# Patient Record
Sex: Female | Born: 1935 | Race: White | Hispanic: No | Smoking: Never smoker
Health system: Southern US, Community
[De-identification: ages and names within clinical notes are randomized; demographics above are authoritative.]

## PROBLEM LIST (undated history)

## (undated) DIAGNOSIS — Z9889 Other specified postprocedural states: Secondary | ICD-10-CM

## (undated) DIAGNOSIS — C50911 Malignant neoplasm of unspecified site of right female breast: Secondary | ICD-10-CM

## (undated) DIAGNOSIS — S329XXA Fracture of unspecified parts of lumbosacral spine and pelvis, initial encounter for closed fracture: Secondary | ICD-10-CM

## (undated) DIAGNOSIS — S42009A Fracture of unspecified part of unspecified clavicle, initial encounter for closed fracture: Secondary | ICD-10-CM

## (undated) DIAGNOSIS — K589 Irritable bowel syndrome without diarrhea: Secondary | ICD-10-CM

## (undated) DIAGNOSIS — R112 Nausea with vomiting, unspecified: Secondary | ICD-10-CM

## (undated) DIAGNOSIS — I10 Essential (primary) hypertension: Secondary | ICD-10-CM

## (undated) DIAGNOSIS — H269 Unspecified cataract: Secondary | ICD-10-CM

## (undated) DIAGNOSIS — M810 Age-related osteoporosis without current pathological fracture: Secondary | ICD-10-CM

## (undated) DIAGNOSIS — I82402 Acute embolism and thrombosis of unspecified deep veins of left lower extremity: Secondary | ICD-10-CM

## (undated) HISTORY — PX: HEMORRHOID SURGERY: SHX153

## (undated) HISTORY — PX: CATARACT EXTRACTION: SUR2

## (undated) HISTORY — PX: ABDOMINAL HYSTERECTOMY: SHX81

## (undated) HISTORY — PX: BREAST SURGERY: SHX581

---

## 2000-11-18 ENCOUNTER — Other Ambulatory Visit: Admission: RE | Admit: 2000-11-18 | Discharge: 2000-11-18 | Payer: Self-pay | Admitting: Internal Medicine

## 2001-10-02 ENCOUNTER — Emergency Department (HOSPITAL_COMMUNITY): Admission: EM | Admit: 2001-10-02 | Discharge: 2001-10-02 | Payer: Self-pay | Admitting: Emergency Medicine

## 2001-10-03 ENCOUNTER — Ambulatory Visit (HOSPITAL_COMMUNITY): Admission: RE | Admit: 2001-10-03 | Discharge: 2001-10-03 | Payer: Self-pay | Admitting: Internal Medicine

## 2001-11-24 ENCOUNTER — Ambulatory Visit (HOSPITAL_COMMUNITY): Admission: RE | Admit: 2001-11-24 | Discharge: 2001-11-24 | Payer: Self-pay | Admitting: Internal Medicine

## 2001-11-24 ENCOUNTER — Encounter: Payer: Self-pay | Admitting: Internal Medicine

## 2008-06-18 ENCOUNTER — Inpatient Hospital Stay (HOSPITAL_COMMUNITY): Admission: EM | Admit: 2008-06-18 | Discharge: 2008-06-20 | Payer: Self-pay | Admitting: Emergency Medicine

## 2008-06-18 ENCOUNTER — Encounter: Payer: Self-pay | Admitting: Emergency Medicine

## 2008-06-18 ENCOUNTER — Ambulatory Visit: Payer: Self-pay | Admitting: Cardiology

## 2008-07-16 ENCOUNTER — Encounter: Admission: RE | Admit: 2008-07-16 | Discharge: 2008-07-16 | Payer: Self-pay | Admitting: Internal Medicine

## 2010-09-24 ENCOUNTER — Encounter: Payer: Self-pay | Admitting: Internal Medicine

## 2011-01-16 NOTE — Consult Note (Signed)
Emily [name], MANNER NO.:  [id number]   MEDICAL RECORD NO.:  [id number]          PATIENT TYPE:  INP   LOCATION:  4733                         FACILITY:  [facility]   PHYSICIAN:  Marca Ancona, MD      DATE OF BIRTH:  1919-02-21   DATE OF CONSULTATION:  06/18/2008  DATE OF DISCHARGE:                                 CONSULTATION   The patient does not have a primary cardiologist.   PRIMARY CARE PHYSICIAN:  Dr. Aviva Kluver.   HISTORY OF PRESENT ILLNESS:  This is an 77 year old without significant  past cardiac history who fell getting out of bed this morning.  She hit  her right side and was found to have 3 right rib fractures and a right  clavicular fracture.  The patient states she woke at 2 in the morning  and needed to go to the bathroom.  She stood up out of bed and  immediately felt lightheaded upon standing.  She fell and hit her right  side on the bed.  There was no syncope.  There was no chest pain.  There  were no palpitations.  She did not feel that her heart had paused or  that her heart was racing.  After falling, she stood back up again, had  no further trouble, went to the bathroom, and went back to bed.  Today,  during the day, she continued to have pain in the right side and right  shoulder.  So she was taken by her family to the emergency department.  There, she was found to have the fractures.  Additionally, she was also  found to have a urinary tract infection.  She reports some urinary  frequency but no dysuria.  The patient does not have a history of  palpitations or syncope.  She has no history of chest pain or  significant dyspnea on exertion.  She has been taking a small dose of  metoprolol at night for hypos, although she denies ever having atrial  fibrillation or any other arrhythmia.   PAST MEDICAL HISTORY:  1. Gastroesophageal reflux disease.  2. Osteoporosis.  3. Degenerative joint disease.  4. History of breast cancer status post  lumpectomy.   MEDICATIONS:  Fosamax and metoprolol tartrate 25 mg p.o. q.p.m.   ALLERGIES:  No known drug allergies.   SOCIAL HISTORY:  The patient lives in [place] with her son and his  wife.  She is retired.  She does not smoke.  She does not drink alcohol  or use illicit drugs.   FAMILY HISTORY:  Not significant for any premature coronary artery  disease.   REVIEW OF SYSTEMS:  Negative except as noted in the history of present  illness.   X-rays show an acute distal right clavicular fracture that is minimally  displaced.  There is a lateral right 5th rib fracture and a right 11th  rib fracture.  Head CT without contrast shows no acute changes.  EKG  shows normal sinus rhythm at the rate of 86 beats per minute.  There is  a borderline interventricular conduction delay.  There  is poor anterior  R-wave progression.   LABORATORIES:  Significant for white count 7.8, hematocrit 38.6, and  platelets 165.  Potassium 3.8, creatinine 0.82, and glucose 110.  Cardiac enzymes are negative x1 set.  Urinalysis shows urinary tract  infection.   PHYSICAL EXAMINATION:  VITAL SIGNS:  Temperature 97.3, pulse 73, blood  pressure 116/63, and O2 sat 96% on 2 L nasal cannula.  GENERAL:  This is an elderly female in no apparent distress.  NEUROLOGICAL:  Alert and oriented x3.  Normal affect.  HEENT:  Normal.  ABDOMEN:  Soft and nontender.  No hepatosplenomegaly.  Normal bowel  sounds.  NECK:  Supple without lymphadenopathy.  No thyromegaly or thyroid  nodule.  JVP is 70 cm.  HEART:  Regular S1 and S2.  No S3.  No S4.  No murmur.  LUNGS:  Clear to auscultation bilaterally.  No respiratory effort.  EXTREMITIES:  There is no peripheral edema.  The posterior tibial pulses  are 2+ bilaterally.  No clubbing or cyanosis.  MUSCULOSKELETAL:  The right arm is in a sling and right shoulder is  tender to palpation.  SKIN:  Normal exam.   ASSESSMENT AND PLAN:  This is an 77 year old with no significant  past  cardiac history who presents after a fall with right clavicle and rib  fractures.  She states that she was lightheaded when she fell.  This  fall happened immediately after standing from lying down in bed.  This  sounds a lot like an orthostatic event, perhaps mediated by dehydration  from her urinary tract infection.  There was no actual episode of  syncope.  She has no history of arrhythmias or other cardiac disease.   Our plan for her would be to keep her on telemetry to assess her rhythm.  We will check a set of orthostatics and have that recorded in the chart.  We will also hold off on giving her metoprolol.  Finally, we will check  an echocardiogram.  She does not have a murmur on exam, and I think that  this event was orthostatic related.  So if she actually leaves over the  weekend, this echo can be done as an outpatient assuming that there are  no arrhythmias on telemetry.      Marca Ancona, MD  Electronically Signed     DM/MEDQ  D:  06/18/2008  T:  06/19/2008  Job:  [id number]

## 2011-01-16 NOTE — Discharge Summary (Signed)
Emily [name], Emily [name]              ACCOUNT NO.:  [id number]   MEDICAL RECORD NO.:  [id number]          PATIENT TYPE:  INP   LOCATION:  4733                         FACILITY:  [facility]   PHYSICIAN:  Ardeth Sportsman, MD     DATE OF BIRTH:  December 24, 1918   DATE OF ADMISSION:  06/18/2008  DATE OF DISCHARGE:  06/20/2008                               DISCHARGE SUMMARY   DISCHARGE DIAGNOSES:  1. Status post fall versus syncope.  2. Right rib fractures x3.  3. Right distal clavicle fracture.  4. Urinary tract infection.  Cultures pending at the time of the      discharge.  5. Osteoporosis.  6. Gastroesophageal reflux disease.  7. Tachycardia.  8. T2 and T3 compression fractures, subacute.   HISTORY ON ADMISSION:  This is a very pleasant 77 year old female who  fell getting up out of bed.  She did report getting mildly dizzy.  She  struck her right side of her shoulder and chest.  She was taken to  [facility] the following morning when she reported this to her  family and was found to have nondisplaced right rib fractures x3 and a  minimally displaced right distal clavicle fracture.  She was also found  to have positive urinalysis and likely has a urinary tract infection as  well.   She was transferred to [facility] for further evaluation and treatment.   She was admitted to the telemetry floor and had no overt arrhythmias.  She has initially kept off her metoprolol as it was felt that perhaps  she had gotten orthostatic due to some mild dehydration associated with  her urinary tract infection.  She had no orthostasis during this  admission and heart rates are running in the 70s to 100 range.  It was  recommended that she have a 2-D echocardiogram done, and this will be  done as an outpatient basis and followup.   With regards to the patient's right rib fractures, she had no  significant pain associated with this and was able to mobilize well.  She had a followup chest x-ray the  following day, which was clear,  showed no active disease.   Right distal clavicle fracture.  The patient did have a right distal  clavicle fracture.  This was treated with a sling, and the patient was  seen in consultation with Dr. Shelle Iron, who recommended followup in a  couple of weeks in his office.   The patient mobilized well with physical therapy and has a very  supportive family who planned to take her home.  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   At this time, the patient is prepared for discharge home.   MEDICATIONS AT THE TIME OF DISCHARGE:  1. Norco 5/325 mg 1-2 p.o. q.4 h. p.r.n. pain, #60, no refill.  2. Septra DS 1 tablet b.i.d. x4 more days.  3. Usual Fosamax weekly dose.  4. Metoprolol 25 mg  p.o. q.a.m.   The patient is to follow up with Dr. Shelle Iron in 2 weeks.  Follow up with  Trauma Service as needed.   She will call  Cardiology to arrange for an outpatient echo and  follow up with Dr. Freida Busman, I think saw her here in the hospital.  She  also needs to see her primary care physician to follow up with  urinalysis and urine culture at the end of the week to ensure that her  UTI is resolving.  Also, she did have note of a calcified left thyroid  nodule and this likely needs evaluation as well.  She can arrange for  this through her primary care physician as well.      Shawn Rayburn, P.A.      Ardeth Sportsman, MD  Electronically Signed    SR/MEDQ  D:  06/20/2008  T:  06/21/2008  Job:  [id number]

## 2011-01-16 NOTE — Consult Note (Signed)
Emily [name], Emily [name]              ACCOUNT NO.:  [id number]   MEDICAL RECORD NO.:  [id number]          PATIENT TYPE:  INP   LOCATION:  4733                         FACILITY:  [facility]   PHYSICIAN:  Jene Every, M.D.    DATE OF BIRTH:  [birth year]   DATE OF CONSULTATION:  DATE OF DISCHARGE:                                 CONSULTATION   CHIEF COMPLAINT:  Right shoulder pain.   HISTORY:  This is an 77 year old female who apparently had a fall,  syncopal episode, was seen in the emergency room, found to have a distal  clavicle resection and multiple rib fractures.  She was admitted by Dr.  Lindie Spruce, General Surgery Trauma Service to the telemetry unit.  Consulted  for evaluation of the distal clavicle and rib fracture.  The patient  also had rib fractures as well.   PAST MEDICAL HISTORY:  Breast cancer, GERD, osteoporosis, status post  lumpectomy.   SOCIAL HISTORY:  Tobacco negative.  Alcoholic beverages negative.  Lives  with her son.   PHYSICAL EXAMINATION:  VITAL SIGNS:  The patient is afebrile.  Blood  pressure 123/66 and pulse is 73.  GENERAL:  This is an elderly female, in mild amount of distress.  Mood  and affect is appropriate.  MUSCULOSKELETAL:  Inspection of the shoulder, soft tissue swelling noted  over the anterior aspect of the shoulder.  The skin is intact.  She is  tender on palpation over the distal clavicle.  Nontender over the  humerus, scapula, cervical spine, and over the sternoclavicular joint  medially.  She is tender to palpation in the anterolateral aspect of the  costochondral region.  Pulses are intact.  Distally in the upper  extremity compartments are soft, it is neurovascularly intact.   Radiographs of the shoulder demonstrate a clavicle fracture, distal  extra-articular, minimally displaced.  There are mild degenerative  changes at glenohumeral joint noted with type 2 acromion.  Rib fractures  noted.   IMPRESSION:  1. Acute closed distal clavicle  fracture extra-articular, minimally      displaced.  2. Associated rib fractures.   PLAN:  It is considerable that I would recommend a sling, ice to limit  the soft tissue swelling.  Gentle range of motion of the forearm and  shoulder as tolerated for she may be out of the sling although may not  tolerate that for the next week or so.  Instructed the patient and her  family that it might take up to 4 or 6 weeks for bone reunion.  We will  see her in the office in 2 weeks for the radiographs of the shoulder to  evaluate for the fracture healing.      Jene Every, M.D.  Electronically Signed     JB/MEDQ  D:  06/18/2008  T:  06/19/2008  Job:  [id number]

## 2011-06-04 LAB — DIFFERENTIAL
Basophils Absolute: 0
Eosinophils Absolute: 0
Eosinophils Relative: 0
Lymphocytes Relative: 13
Lymphs Abs: 1
Monocytes Relative: 6
Neutro Abs: 6.3

## 2011-06-04 LAB — URINE MICROSCOPIC-ADD ON

## 2011-06-04 LAB — URINALYSIS, ROUTINE W REFLEX MICROSCOPIC
Glucose, UA: NEGATIVE
Hgb urine dipstick: NEGATIVE
Ketones, ur: NEGATIVE
Nitrite: NEGATIVE
Protein, ur: NEGATIVE

## 2011-06-04 LAB — COMPREHENSIVE METABOLIC PANEL
AST: 43 — ABNORMAL HIGH
BUN: 15
Chloride: 105
GFR calc non Af Amer: >60
Glucose, Bld: 110 — ABNORMAL HIGH
Sodium: 141
Total Bilirubin: 1

## 2011-06-04 LAB — URINE CULTURE

## 2011-06-04 LAB — CBC
MCV: 92.7
RBC: 4.16
WBC: 7.8

## 2011-06-04 LAB — POCT CARDIAC MARKERS: Myoglobin, poc: 138

## 2012-04-15 ENCOUNTER — Inpatient Hospital Stay (HOSPITAL_COMMUNITY): Payer: Medicare Other

## 2012-04-15 ENCOUNTER — Emergency Department (HOSPITAL_COMMUNITY): Payer: Medicare Other

## 2012-04-15 ENCOUNTER — Inpatient Hospital Stay (HOSPITAL_COMMUNITY)
Admission: EM | Admit: 2012-04-15 | Discharge: 2012-04-17 | DRG: 536 | Disposition: A | Discharge status: Home Health | Payer: Medicare Other | Attending: Internal Medicine | Admitting: Internal Medicine

## 2012-04-15 ENCOUNTER — Encounter (HOSPITAL_COMMUNITY): Payer: Self-pay

## 2012-04-15 DIAGNOSIS — M810 Age-related osteoporosis without current pathological fracture: Secondary | ICD-10-CM | POA: Diagnosis present

## 2012-04-15 DIAGNOSIS — W010XXA Fall on same level from slipping, tripping and stumbling without subsequent striking against object, initial encounter: Secondary | ICD-10-CM | POA: Diagnosis present

## 2012-04-15 DIAGNOSIS — G4700 Insomnia, unspecified: Secondary | ICD-10-CM | POA: Diagnosis present

## 2012-04-15 DIAGNOSIS — Z66 Do not resuscitate: Secondary | ICD-10-CM | POA: Diagnosis present

## 2012-04-15 DIAGNOSIS — S329XXA Fracture of unspecified parts of lumbosacral spine and pelvis, initial encounter for closed fracture: Secondary | ICD-10-CM | POA: Diagnosis present

## 2012-04-15 DIAGNOSIS — S32509A Unspecified fracture of unspecified pubis, initial encounter for closed fracture: Principal | ICD-10-CM | POA: Diagnosis present

## 2012-04-15 DIAGNOSIS — I10 Essential (primary) hypertension: Secondary | ICD-10-CM | POA: Diagnosis present

## 2012-04-15 DIAGNOSIS — R52 Pain, unspecified: Secondary | ICD-10-CM | POA: Diagnosis present

## 2012-04-15 DIAGNOSIS — Y92009 Unspecified place in unspecified non-institutional (private) residence as the place of occurrence of the external cause: Secondary | ICD-10-CM

## 2012-04-15 HISTORY — DX: Other specified postprocedural states: Z98.890

## 2012-04-15 HISTORY — DX: Irritable bowel syndrome without diarrhea: K58.9

## 2012-04-15 HISTORY — DX: Unspecified cataract: H26.9

## 2012-04-15 HISTORY — DX: Fracture of unspecified part of unspecified clavicle, initial encounter for closed fracture: S42.009A

## 2012-04-15 HISTORY — DX: Essential (primary) hypertension: I10

## 2012-04-15 HISTORY — DX: Nausea with vomiting, unspecified: R11.2

## 2012-04-15 LAB — BASIC METABOLIC PANEL
BUN: 17 mg/dL (ref 6–23)
CO2: 30 mEq/L (ref 19–32)
Chloride: 103 mEq/L (ref 96–112)
Creatinine, Ser: 0.75 mg/dL (ref 0.50–1.10)
GFR calc Af Amer: 83 mL/min — ABNORMAL LOW (ref >90)
Glucose, Bld: 97 mg/dL (ref 70–99)

## 2012-04-15 LAB — CBC WITH DIFFERENTIAL/PLATELET
Basophils Relative: 0 % (ref 0–1)
HCT: 41.1 % (ref 36.0–46.0)
Hemoglobin: 13.5 g/dL (ref 12.0–15.0)
MCHC: 32.8 g/dL (ref 30.0–36.0)
Monocytes Absolute: 0.6 10*3/uL (ref 0.1–1.0)
Monocytes Relative: 5 % (ref 3–12)
Neutro Abs: 11.8 10*3/uL — ABNORMAL HIGH (ref 1.7–7.7)

## 2012-04-15 MED ORDER — SODIUM CHLORIDE 0.9 % IV SOLN
INTRAVENOUS | Status: DC
  Administered 2012-04-15 – 2012-04-16 (×2): via INTRAVENOUS

## 2012-04-15 MED ORDER — ONDANSETRON HCL 4 MG/2ML IJ SOLN: 4.0000 mg | Freq: Four times a day (QID) | INTRAMUSCULAR | Status: DC | PRN

## 2012-04-15 MED ORDER — ONDANSETRON HCL 4 MG/2ML IJ SOLN
4.0000 mg | Freq: Once | INTRAMUSCULAR | Status: AC
  Administered 2012-04-15: 4 mg via INTRAVENOUS
  Filled 2012-04-15: qty 2

## 2012-04-15 MED ORDER — MORPHINE SULFATE 2 MG/ML IJ SOLN
2.0000 mg | INTRAMUSCULAR | Status: DC | PRN
  Administered 2012-04-15: 2 mg via INTRAVENOUS
  Filled 2012-04-15: qty 1

## 2012-04-15 MED ORDER — METOPROLOL TARTRATE 25 MG PO TABS
25.0000 mg | ORAL_TABLET | Freq: Two times a day (BID) | ORAL | Status: DC
  Administered 2012-04-15 – 2012-04-17 (×4): 25 mg via ORAL
  Filled 2012-04-15 (×5): qty 1

## 2012-04-15 MED ORDER — ACETAMINOPHEN 650 MG RE SUPP: 650.0000 mg | Freq: Four times a day (QID) | RECTAL | Status: DC | PRN

## 2012-04-15 MED ORDER — DORZOLAMIDE HCL 2 % OP SOLN
1.0000 [drp] | Freq: Every day | OPHTHALMIC | Status: DC
  Filled 2012-04-15: qty 10

## 2012-04-15 MED ORDER — ACETAMINOPHEN 325 MG PO TABS
650.0000 mg | ORAL_TABLET | Freq: Four times a day (QID) | ORAL | Status: DC | PRN
  Administered 2012-04-16: 650 mg via ORAL
  Filled 2012-04-15: qty 2

## 2012-04-15 MED ORDER — SODIUM CHLORIDE 0.9 % IV SOLN
Freq: Once | INTRAVENOUS | Status: AC
  Administered 2012-04-15: 17:00:00 via INTRAVENOUS

## 2012-04-15 MED ORDER — ENOXAPARIN SODIUM 30 MG/0.3ML ~~LOC~~ SOLN
30.0000 mg | SUBCUTANEOUS | Status: DC
  Administered 2012-04-15 – 2012-04-16 (×2): 30 mg via SUBCUTANEOUS
  Filled 2012-04-15 (×3): qty 0.3

## 2012-04-15 MED ORDER — NEPAFENAC 0.1 % OP SUSP
3.0000 [drp] | Freq: Three times a day (TID) | OPHTHALMIC | Status: DC
  Administered 2012-04-16: 3 [drp] via OPHTHALMIC

## 2012-04-15 NOTE — H&P (Signed)
Emily [name] is an 78 y.o. female.   PCP:   Minda Meo, MD   Chief Complaint:  Pelvic fracture  HPI: 78 yo Elderly F from home, tripped and fell. No LOC, no blood-thinners. Landed on hand and left hip, no head injury. Pt reports pain at Left hip and mid back. Alert, oriented. Full ROM on all extremities. Acute nondisplaced fractures of the left superior and inferior pelvic ramus. 2.  No hip fracture.  Will admit for pain control and possibly needing SNF until recovered.  Currently on O2 and a little sleepy from the MSO4 that she has been getting for her pain.  Tripped on 65 yr old great Granddaughter.     Past Medical History:  Past Medical History  Diagnosis Date  . Hypertension   . Cataract     Past Surgical History  Procedure Date  . Cataract extraction       Allergies:   Allergies  Allergen Reactions  . Penicillins      Medications: Prior to Admission medications   Medication Sig Start Date End Date Taking? Authorizing Provider  dorzolamide (TRUSOPT) 2 % ophthalmic solution Place 1 drop into the left eye daily.   Yes Historical Provider, MD  metoprolol tartrate (LOPRESSOR) 25 MG tablet Take 25 mg by mouth 2 (two) times daily.   Yes Historical Provider, MD  nepafenac (NEVANAC) 0.1 % ophthalmic suspension Place 3 drops into the left eye 3 (three) times daily.   Yes Historical Provider, MD      (Not in a hospital admission)   Social History:  reports that she has never smoked. She does not have any smokeless tobacco history on file. She reports that she does not drink alcohol or use illicit drugs.  Lives with family  Family History: No family history on file.  Review of Systems:  Review of Systems - See HPI. All ROS o/w (-)   Physical Exam:  Blood pressure 156/70, pulse 78, temperature 98.7 F (37.1 C), temperature source Oral, resp. rate 18, SpO2 93.00%. Filed Vitals:   04/15/12 1445 04/15/12 1446 04/15/12 1540  BP: 157/100 173/73 156/70    Pulse: 75 81 78  Temp: 97.9 F (36.6 C) 98 F (36.7 C) 98.7 F (37.1 C)  TempSrc: Oral Oral Oral  Resp: 16 18 18   SpO2: 96% 97% 93%   General appearance: Looks younger than stated age. Head: Normocephalic, without obvious abnormality, atraumatic Eyes: conjunctivae/corneas clear. PERRL, EOM's intact.  Nose: Nares normal. Septum midline. Mucosa normal. No drainage or sinus tenderness. Throat: lips, mucosa, and tongue normal; teeth and gums normal Neck: no adenopathy, no carotid bruit, no JVD and thyroid not enlarged, symmetric, no tenderness/mass/nodules Resp: CTA Cardio: Reg GI: soft, non-tender; bowel sounds normal; no masses,  no organomegaly Extremities: extremities normal, atraumatic, no cyanosis or edema Pulses: 2+ and symmetric Lymph nodes:no cervical lymphadenopathy Neurologic: Alert and oriented X 3. Can move all 4s.  Pain with leg lifts. Groggy Kyphosis     Labs on Admission:   [facility] 04/15/12 1630  NA 140  K 3.8  CL 103  CO2 30  GLUCOSE 97  BUN 17  CREATININE 0.75  CALCIUM 9.7  MG --  PHOS --   No results found for this basename: AST:2,ALT:2,ALKPHOS:2,BILITOT:2,PROT:2,ALBUMIN:2 in the last 72 hours No results found for this basename: LIPASE:2,AMYLASE:2 in the last 72 hours  Basename 04/15/12 1630  WBC 13.5*  NEUTROABS 11.8*  HGB 13.5  HCT 41.1  MCV 91.7  PLT 162  No results found for this basename: CKTOTAL:3,CKMB:3,CKMBINDEX:3,TROPONINI:3 in the last 72 hours No results found for this basename: INR,  PROTIME     LAB RESULT POCT:  Results for orders placed during the hospital encounter of 04/15/12  CBC WITH DIFFERENTIAL      Component Value Range   WBC 13.5 (*) 4.0 - 10.5 K/uL   RBC 4.48  3.87 - 5.11 MIL/uL   Hemoglobin 13.5  12.0 - 15.0 g/dL   HCT 16.1  09.6 - 04.5 %   MCV 91.7  78.0 - 100.0 fL   MCH 30.1  26.0 - 34.0 pg   MCHC 32.8  30.0 - 36.0 g/dL   RDW 40.9  81.1 - 91.4 %   Platelets 162  150 - 400 K/uL   Neutrophils Relative  88 (*) 43 - 77 %   Neutro Abs 11.8 (*) 1.7 - 7.7 K/uL   Lymphocytes Relative 7 (*) 12 - 46 %   Lymphs Abs 1.0  0.7 - 4.0 K/uL   Monocytes Relative 5  3 - 12 %   Monocytes Absolute 0.6  0.1 - 1.0 K/uL   Eosinophils Relative 0  0 - 5 %   Eosinophils Absolute 0.0  0.0 - 0.7 K/uL   Basophils Relative 0  0 - 1 %   Basophils Absolute 0.0  0.0 - 0.1 K/uL  BASIC METABOLIC PANEL      Component Value Range   Sodium 140  135 - 145 mEq/L   Potassium 3.8  3.5 - 5.1 mEq/L   Chloride 103  96 - 112 mEq/L   CO2 30  19 - 32 mEq/L   Glucose, Bld 97  70 - 99 mg/dL   BUN 17  6 - 23 mg/dL   Creatinine, Ser 7.82  0.50 - 1.10 mg/dL   Calcium 9.7  8.4 - 95.6 mg/dL   GFR calc non Af Amer 72 (*) >90 mL/min   GFR calc Af Amer 83 (*) >90 mL/min      Radiological Exams on Admission: Dg Hip Complete Left  04/15/2012  *RADIOLOGY REPORT*  Clinical Data: Larey Seat, left hip pain  LEFT HIP - COMPLETE 2+ VIEW  Comparison: None.  Findings: Both hips are in normal position with only mild to moderate degenerative joint disease for age.  No acute fracture is seen.  However, there are acute fractures of both the left superior and inferior pelvic ramus.  The right ramus is intact.  The SI joints appear normal.  There are degenerative changes in the lower lumbar spine.  IMPRESSION:  1.  Acute nondisplaced fractures of the left superior and inferior pelvic ramus. 2.  No hip fracture.  Original Report Authenticated By: Juline Patch, M.D.      No orders found for this or any previous visit.   Assessment/Plan Principal Problem:  *Pelvic fracture Active Problems:  Pain  HTN (hypertension)  Osteoporosis  Pelvic Fracture in Pt with Osteoporosis and presumed Vertebral Fxs and h/o Shoulder Fx that she saw Dr Madelon Lips in past. Admit. Pain control. Foley PT/OT/CW. Will need SNF HTN - BP is better post treatment of pain.  On Toprol Monitor. DNR - per pt request. OK for home eye gtts. O2 ? Prolia vrs Forteo. May need  fiber but she has IBS and normally has some Diarrhea - will monitor.  Shanikka Wonders M 04/15/2012, 7:33 PM

## 2012-04-15 NOTE — ED Provider Notes (Signed)
History     CSN: [id number]  Arrival date & time 04/15/12  1427   First MD Initiated Contact with Patient 04/15/12 1554      Chief Complaint  Patient presents with  . Fall    (Consider location/radiation/quality/duration/timing/severity/associated sxs/prior treatment) Patient is a 78 y.o. female presenting with fall. The history is provided by the patient.  Fall   patient here after a fall from tripping over a small animal. There was no loss of consciousness. Complains of pain to her left hip and was unable to ambulate after the incident. Denies any neck or back pain. Denies any rib or abdominal pain. Pain starts in her left hip based on her leg. Denies any numbness to her extremity. EMS was called and patient transported here  Past Medical History  Diagnosis Date  . Hypertension   . Cataract     Past Surgical History  Procedure Date  . Cataract extraction     No family history on file.  History  Substance Use Topics  . Smoking status: Never Smoker   . Smokeless tobacco: Not on file  . Alcohol Use: No    OB History    Grav Para Term Preterm Abortions TAB SAB Ect Mult Living                  Review of Systems  All other systems reviewed and are negative.    Allergies  Penicillins  Home Medications   Current Outpatient Rx  Name Route Sig Dispense Refill  . DORZOLAMIDE HCL 2 % OP SOLN Left Eye Place 1 drop into the left eye daily.    Marland Kitchen METOPROLOL TARTRATE 25 MG PO TABS Oral Take 25 mg by mouth 2 (two) times daily.    Marland Kitchen NEPAFENAC 0.1 % OP SUSP Left Eye Place 3 drops into the left eye 3 (three) times daily.      BP 156/70  Pulse 78  Temp 98.7 F (37.1 C) (Oral)  Resp 18  SpO2 93%  Physical Exam  Nursing note and vitals reviewed. Constitutional: She is oriented to person, place, and time. She appears well-developed and well-nourished.  Non-toxic appearance. No distress.  HENT:  Head: Normocephalic and atraumatic.  Eyes: Conjunctivae, EOM and lids  are normal. Pupils are equal, round, and reactive to light.  Neck: Normal range of motion. Neck supple. No tracheal deviation present. No mass present.  Cardiovascular: Normal rate, regular rhythm and normal heart sounds.  Exam reveals no gallop.   No murmur heard. Pulmonary/Chest: Effort normal and breath sounds normal. No stridor. No respiratory distress. She has no decreased breath sounds. She has no wheezes. She has no rhonchi. She has no rales.  Abdominal: Soft. Normal appearance and bowel sounds are normal. She exhibits no distension. There is no tenderness. There is no rebound and no CVA tenderness.  Musculoskeletal: Normal range of motion. She exhibits no edema and no tenderness.       Left lower extremity pain with movement. No shortening or rotation. Left dp pulse 2 plus, ski intact  Neurological: She is alert and oriented to person, place, and time. She has normal strength. No cranial nerve deficit or sensory deficit. GCS eye subscore is 4. GCS verbal subscore is 5. GCS motor subscore is 6.  Skin: Skin is warm and dry. No abrasion and no rash noted.  Psychiatric: She has a normal mood and affect. Her speech is normal and behavior is normal.    ED Course  Procedures (including critical  care time)   Labs Reviewed  CBC WITH DIFFERENTIAL  BASIC METABOLIC PANEL   No results found.   No diagnosis found.    MDM  Spoke with dr. Timothy Lasso, wil come to admit for pain control and NH placment        Toy Baker, MD 04/15/12 1900

## 2012-04-15 NOTE — ED Notes (Signed)
From home, tripped and fell. No LOC, no blood-thinners. Landed on hand and left hip, no head injury. Pt reports pain at Left hip and mid back. Alert, oriented. Full ROM on all extremities.

## 2012-04-15 NOTE — ED Notes (Signed)
WUJ:[id number]<JY> Expected date:04/15/12<BR> Expected time: 2:11 PM<BR> Means of arrival:Ambulance<BR> Comments:<BR> [place]

## 2012-04-15 NOTE — Progress Notes (Signed)
Richard aronson pcp EPIC updated

## 2012-04-16 LAB — BASIC METABOLIC PANEL
BUN: 19 mg/dL (ref 6–23)
CO2: 27 mEq/L (ref 19–32)
Calcium: 9 mg/dL (ref 8.4–10.5)
Creatinine, Ser: 0.78 mg/dL (ref 0.50–1.10)
Glucose, Bld: 130 mg/dL — ABNORMAL HIGH (ref 70–99)

## 2012-04-16 LAB — CBC
MCH: 30.6 pg (ref 26.0–34.0)
MCHC: 33.3 g/dL (ref 30.0–36.0)
MCV: 91.9 fL (ref 78.0–100.0)
Platelets: 132 10*3/uL — ABNORMAL LOW (ref 150–400)
RDW: 13.1 % (ref 11.5–15.5)

## 2012-04-16 MED ORDER — CALCITONIN (SALMON) 200 UNIT/ACT NA SOLN
1.0000 | Freq: Every day | NASAL | Status: DC
  Administered 2012-04-16 – 2012-04-17 (×2): 1 via NASAL
  Filled 2012-04-16: qty 3.7

## 2012-04-16 MED ORDER — HYDROCODONE-ACETAMINOPHEN 5-325 MG PO TABS
1.0000 | ORAL_TABLET | ORAL | Status: DC | PRN
  Administered 2012-04-16: 2 via ORAL
  Administered 2012-04-16 – 2012-04-17 (×3): 1 via ORAL
  Filled 2012-04-16: qty 1
  Filled 2012-04-16: qty 2
  Filled 2012-04-16 (×2): qty 1

## 2012-04-16 MED ORDER — NEPAFENAC 0.1 % OP SUSP
1.0000 [drp] | Freq: Three times a day (TID) | OPHTHALMIC | Status: DC
  Administered 2012-04-16 – 2012-04-17 (×3): 1 [drp] via OPHTHALMIC

## 2012-04-16 MED ORDER — ZOLPIDEM TARTRATE 5 MG PO TABS: 5.0000 mg | ORAL_TABLET | Freq: Every evening | ORAL | Status: DC | PRN

## 2012-04-16 NOTE — Progress Notes (Signed)
CSW met with pt/family this morning to assist with d/c planning. PT has evaluated and recommends HHPT. Pt/Family are in agreement with this decision. RNCM will assist with arranging home services.  Cori Razor LCSW [phone]

## 2012-04-16 NOTE — Progress Notes (Signed)
CARE MANAGEMENT NOTE 04/16/2012  Patient:  Emily [name], Emily [name]   Account Number:  [id number]  Date Initiated:  04/16/2012  Documentation initiated by:  Colleen Can  Subjective/Objective Assessment:   dx bilateral pelvic fracture following fall at home     Action/Plan:   CM spoke with patient and family members. Plans are for patient to return to her home in [place], [place] where her 2 sons and thire wives will be her caregivers. She is declining SNF. Pt is requesting HH services.   Anticipated DC Date:  04/18/2012   Anticipated DC Plan:  HOME W HOME HEALTH SERVICES  In-house referral  Clinical Social Worker      DC Planning Services  CM consult      PAC Choice  DURABLE MEDICAL EQUIPMENT  HOME HEALTH   Choice offered to / List presented to:  C-4 Adult Children   DME arranged  NA      DME agency  NA        HH agency  OTHER - SEE NOTE   Status of service:  In process, will continue to follow Medicare Important Message given?   (If response is "NO", the following Medicare IM given date fields will be blank) Date Medicare IM given:   Date Additional Medicare IM given:    Discharge Disposition:    Per UR Regulation:  Reviewed for med. necessity/level of care/duration of stay  Comments:  04/16/2012 Raynelle Bring BSN CCM [phone] Family is requesting [facility] -[phone] [place] for [facility] services .Pt already has RW. BSC. They have access to wheelchair as needed. Tehy are requesting specific physical therapist. Home health orders for [facility] and HHpt services need to be written by MD. These ordrs will be faxed to [facility] agency. CM will follow for orders.

## 2012-04-16 NOTE — Progress Notes (Signed)
Subjective: Feeling better.  Pain is controlled when lying flat.  Pain increases when she rolls over or tries to bear weight.  "When can I go back to work."  Objective: Vital signs in last 24 hours: Temp:  [97.9 F (36.6 C)-99.3 F (37.4 C)] 99.3 F (37.4 C) (08/14 0630) Pulse Rate:  [75-83] 79  (08/14 0630) Resp:  [16-18] 16  (08/14 0630) BP: (117-173)/(63-100) 117/64 mmHg (08/14 0630) SpO2:  [90 %-98 %] 91 % (08/14 0630) Weight:  [48.081 kg (106 lb)] 48.081 kg (106 lb) (08/13 2110) Weight change:  Last BM Date: 04/15/12  CBG (last 3)  No results found for this basename: GLUCAP:3 in the last 72 hours  Intake/Output from previous day: 08/13 0701 - 08/14 0700 In: 271.3 [P.O.:120; I.V.:151.3] Out: -  Intake/Output this shift:    General appearance: alert and no acute distress, appears younger than stated age Eyes: no scleral icterus Throat: oropharynx moist without erythema Resp: clear to auscultation bilaterally Cardio: regular rate and rhythm, S1, S2 normal, no murmur, click, rub or gallop GI: soft, non-tender; bowel sounds normal; no masses,  no organomegaly Extremities: no clubbing, cyanosis or edema; minimal tenderness over left posterior hip   Lab Results:  Basename 04/16/12 0441 04/15/12 1630  NA 140 140  K 3.8 3.8  CL 104 103  CO2 27 30  GLUCOSE 130* 97  BUN 19 17  CREATININE 0.78 0.75  CALCIUM 9.0 9.7  MG -- --  PHOS -- --   No results found for this basename: AST:2,ALT:2,ALKPHOS:2,BILITOT:2,PROT:2,ALBUMIN:2 in the last 72 hours  Basename 04/16/12 0441 04/15/12 1630  WBC 10.4 13.5*  NEUTROABS -- 11.8*  HGB 11.8* 13.5  HCT 35.4* 41.1  MCV 91.9 91.7  PLT 132* 162   No results found for this basename: INR, PROTIME   No results found for this basename: CKTOTAL:3,CKMB:3,CKMBINDEX:3,TROPONINI:3 in the last 72 hours No results found for this basename: TSH,T4TOTAL,FREET3,T3FREE,THYROIDAB in the last 72 hours No results found for this basename:  VITAMINB12:2,FOLATE:2,FERRITIN:2,TIBC:2,IRON:2,RETICCTPCT:2 in the last 72 hours  Studies/Results: Dg Hip Complete Left  04/15/2012  *RADIOLOGY REPORT*  Clinical Data: Larey Seat, left hip pain  LEFT HIP - COMPLETE 2+ VIEW  Comparison: None.  Findings: Both hips are in normal position with only mild to moderate degenerative joint disease for age.  No acute fracture is seen.  However, there are acute fractures of both the left superior and inferior pelvic ramus.  The right ramus is intact.  The SI joints appear normal.  There are degenerative changes in the lower lumbar spine.  IMPRESSION:  1.  Acute nondisplaced fractures of the left superior and inferior pelvic ramus. 2.  No hip fracture.  Original Report Authenticated By: Juline Patch, M.D.   Portable Chest 1 View  04/15/2012  *RADIOLOGY REPORT*  Clinical Data: Hypoxia.  Kyphosis.  PORTABLE CHEST - 1 VIEW  Comparison: 06/19/2008.  Findings: Emphysema is severe.  No pneumothorax.  Osteopenia. Right axillary dissection clips.  No airspace disease or effusion.  The pulmonary vascular congestion is present with interstitial pulmonary edema.  Mitral annular calcification is present.  Interlobular septal thickening is present in the periphery.  Subsegmental atelectasis present in the left mid and lower lung. The cardiopericardial silhouette appears similar to prior exam, within normal limits for projection.  IMPRESSION: 1.  Severe emphysema. 2.  Pulmonary vascular congestion and interstitial pulmonary edema.  Original Report Authenticated By: Andreas Newport, M.D.     Medications: Scheduled:   . sodium chloride   Intravenous  Once  . dorzolamide  1 drop Left Eye Daily  . enoxaparin (LOVENOX) injection  30 mg Subcutaneous Q24H  . metoprolol tartrate  25 mg Oral BID  . nepafenac  3 drop Left Eye TID  . ondansetron  4 mg Intravenous Once   Continuous:   . sodium chloride 20 mL/hr at 04/15/12 2226    Assessment/Plan: Principal Problem: 1. *Pelvic  fracture- continue pain management- will provide Tylenol, Vicodin, MSO4 as options for mild, moderate, severe pain, respectively.  Add Miacalcin.  She prefers to go home with 24 hour assistance (including family members who are PT and RN).  Will ask PT to assist with home DME recommendations (may benefit from hospital bed, bedside commode, walker).    Active Problems: 2. HTN (hypertension)- BP controlled with home meds.   3. Osteoporosis- add Miaclcin for acute bone pain.  Currently on "drug holiday" after prolonged bisphosphonate use (~12 years).  Consider forteo once stable. 4. Insomnia- will use low dose Ambien in the hospital but hesitant to use at home due to fall precautions. 5. Disposition- anticipate discharge to home in 1-2 days if pain controlled with oral medications and home care in place.     LOS: 1 day   Dalaya Suppa,W DOUGLAS 04/16/2012, 7:27 AM

## 2012-04-16 NOTE — Progress Notes (Signed)
Social work consult received for possible SNF placement .H&P reviewed. CSW will see pt this am to assist with d/c planning. PT Eval pending.  Cori Razor LCSW [phone]

## 2012-04-16 NOTE — Evaluation (Signed)
Physical Therapy Evaluation Patient Details Name: Emily [name] MRN: [id number] DOB: October 31, 1918 Today's Date: 04/16/2012 Time: 4098-1191 PT Time Calculation (min): 13 min  PT Assessment / Plan / Recommendation Clinical Impression  78 yo female admitted with acute non-displaced fracture of L superior/inferior pelvic ramus. Family very supportive and they feel they can manage with pt at home. Recommend HHPT.     PT Assessment  Patient needs continued PT services    Follow Up Recommendations  Home health PT;Supervision/Assistance - 24 hour    Barriers to Discharge        Equipment Recommendations  Youth Rolling walker with 5" wheels;3 in 1 bedside commode; (Possibly wheelchair)   Recommendations for Other Services OT consult   Frequency Min 3X/week    Precautions / Restrictions Precautions Precautions: Fall Restrictions Weight Bearing Restrictions: No   Pertinent Vitals/Pain       Mobility  Bed Mobility Bed Mobility: Supine to Sit Supine to Sit: HOB elevated;With rails;4: Min assist Details for Bed Mobility Assistance: VCs safety, technique. Increased time. Assist for L LE off bed. Transfers Transfers: Sit to Stand;Stand to [employer] Transfers Sit to Stand: 4: Min assist;With upper extremity assist;From bed Stand to Sit: 4: Min assist;With upper extremity assist;To chair/3-in-1 Stand Pivot Transfers: 4: Min assist Details for Transfer Assistance: VCs safety, technique, hand placement. Assit to rise, stabilize, control descent, maneuver with RW. Ambulation/Gait Ambulation/Gait Assistance: 4: Min assist Ambulation Distance (Feet): 3 Feet Assistive device: Rolling walker Ambulation/Gait Assistance Details: VCS safety, technique, sequence. Pt reports increased pain with WBing. Limited activity tolerance.  Gait Pattern: Step-to pattern;Trunk flexed;Antalgic;Decreased stride length;Decreased step length - right;Decreased step length - left    Exercises     PT  Diagnosis: Difficulty walking;Abnormality of gait;Acute pain  PT Problem List: Decreased activity tolerance;Decreased mobility;Pain;Decreased knowledge of use of DME PT Treatment Interventions: DME instruction;Gait training;Functional mobility training;Therapeutic activities;Therapeutic exercise;Patient/family education   PT Goals Acute Rehab PT Goals PT Goal Formulation: With patient/family Time For Goal Achievement: 04/23/12 Potential to Achieve Goals: Fair Pt will go Supine/Side to Sit: with supervision PT Goal: Supine/Side to Sit - Progress: Goal set today Pt will go Sit to Supine/Side: with supervision PT Goal: Sit to Supine/Side - Progress: Goal set today Pt will go Sit to Stand: with supervision PT Goal: Sit to Stand - Progress: Goal set today Pt will Transfer Bed to Chair/Chair to Bed: with supervision PT Transfer Goal: Bed to Chair/Chair to Bed - Progress: Goal set today Pt will Ambulate: 16 - 50 feet;with supervision;with rolling walker PT Goal: Ambulate - Progress: Goal set today  Visit Information  Last PT Received On: 04/16/12 Assistance Needed: +1    Subjective Data  Subjective: "Its a 10 (when walking)" Patient Stated Goal: Less pain. Home   Prior Functioning  Home Living Lives With: Family Available Help at Discharge: Family Type of Home: House Home Access: Ramped entrance Home Layout: One level Bathroom Toilet: Standard Home Adaptive Equipment: None Prior Function Level of Independence: Independent Communication Communication: HOH    Cognition  Overall Cognitive Status: Appears within functional limits for tasks assessed/performed Arousal/Alertness: Awake/alert Orientation Level: Appears intact for tasks assessed Behavior During Session: [facility] for tasks performed    Extremity/Trunk Assessment Right Lower Extremity Assessment RLE ROM/Strength/Tone: [facility] for tasks assessed Left Lower Extremity Assessment LLE ROM/Strength/Tone: Deficits;Unable to fully  assess;Due to pain   Balance    End of Session PT - End of Session Equipment Utilized During Treatment: Gait belt Activity Tolerance: Patient limited by  pain Patient left: in chair;with call bell/phone within reach;with family/visitor present  GP     Rebeca Alert [facility] 04/16/2012, 11:03 AM [phone]

## 2012-04-17 MED ORDER — HYDROCODONE-ACETAMINOPHEN 5-325 MG PO TABS: 1.0000 | ORAL_TABLET | ORAL | Status: AC | PRN

## 2012-04-17 MED ORDER — ACETAMINOPHEN 325 MG PO TABS: 650.0000 mg | ORAL_TABLET | Freq: Four times a day (QID) | ORAL | Status: AC | PRN

## 2012-04-17 MED ORDER — CALCITONIN (SALMON) 200 UNIT/ACT NA SOLN: NASAL | Status: DC

## 2012-04-17 NOTE — Care Management Note (Signed)
    Page 1 of 2   04/17/2012     11:51:35 AM   CARE MANAGEMENT NOTE 04/17/2012  Patient:  Emily [name], Emily [name]   Account Number:  [id number]  Date Initiated:  04/16/2012  Documentation initiated by:  Colleen Can  Subjective/Objective Assessment:   dx bilateral pelvic fracture following fall at home     Action/Plan:   CM spoke with patient and family members. Plans are for patient to return to her home in [place], [place] where her 2 sons and thire wives will be her caregivers. She is declining SNF. Pt is requesting HH services.   Anticipated DC Date:  04/18/2012   Anticipated DC Plan:  HOME W HOME HEALTH SERVICES  In-house referral  Clinical Social Worker      DC Planning Services  CM consult      PAC Choice  DURABLE MEDICAL EQUIPMENT  HOME HEALTH   Choice offered to / List presented to:  C-4 Adult Children   DME arranged  NA      DME agency  NA     HH arranged  HH-1 RN  HH-2 PT  HH-3 OT      [facility] agency  [facility]   Status of service:  Completed, signed off Medicare Important Message given?  NA - LOS <3 / Initial given by admissions (If response is "NO", the following Medicare IM given date fields will be blank) Date Medicare IM given:   Date Additional Medicare IM given:    Discharge Disposition:  HOME W HOME HEALTH SERVICES  Per UR Regulation:  Reviewed for med. necessity/level of care/duration of stay  If discussed at Long Length of Stay Meetings, dates discussed:    Comments:  04/17/2012 Raynelle Bring BSN CCM [phone] CM called by Olegario Messier at [facility] to advise that they are not in network with pt's insurance. Pt and family members notified. They want a network provider and are requesting [facility] to provide [facility] services. CM talked with Tammy at [facility] [phone] who states they are in network and to send face sheet, hh orders, h&P, PT notes, d/c summary, face to face to [phone]. Confirmation received.  Pt and daughter-in-law notified that [facility] will provide [facility] services .   04/16/2012 Raynelle Bring BSN CCM [phone] Family is requesting Care [facility] -[phone] [place] for [facility] services .Pt already has RW. BSC. They have access to wheelchair as needed. They are requesting specific physical therapist. Home health orders for [facility] and HHpt services need to be written by MD. These ordrs will be faxed to [facility] agency. CM will follow for orders.

## 2012-04-17 NOTE — Evaluation (Signed)
Occupational Therapy Evaluation Patient Details Name: MARC SIVERTSEN MRN: [id number] DOB: 1918/09/23 Today's Date: 04/17/2012 Time: 4098-1191 OT Time Calculation (min): 17 min  OT Assessment / Plan / Recommendation Clinical Impression  This 78 year old female was admitted for nondisplaced pelvic fx.  She plans to discharge home with family's assistance.  All education was completed. No further OT needs at this time.  Pt has 3:1 commode and shower seat at home    OT Assessment  Patient does not need any further OT services    Follow Up Recommendations  No OT follow up    Barriers to Discharge      Equipment Recommendations  None recommended by OT;None recommended by PT (has all dme)    Recommendations for Other Services    Frequency       Precautions / Restrictions Precautions Precautions: Fall Restrictions Weight Bearing Restrictions: No   Pertinent Vitals/Pain None sitting, increases with weight bearing (8/10); repositioned, and pain began to subside    ADL  Lower Body Dressing: Performed;Maximal assistance Where Assessed - Lower Body Dressing: Supported sit to stand Toilet Transfer: Simulated;Minimal assistance Toilet Transfer Method: Sit to stand (ambulated around bed to chair) Transfers/Ambulation Related to ADLs: min A ambulating in room ADL Comments: Educated on shower transfer:  pt believes she will be able to do this, but right now, pain increases with weight bearing.  Reviewed AE (long handled net).  She doesn't have a reacher, and family will help with adls.      OT Diagnosis:    OT Problem List:   OT Treatment Interventions:     OT Goals    Visit Information  Last OT Received On: 04/17/12 Assistance Needed: +1    Subjective Data  Subjective: Someone will be with her all the time.  We live next door Patient Stated Goal: Get back to work:  owns tie store in [place]   Prior Functioning  Vision/Perception  Home Living Available Help at Discharge:  Family Bathroom Shower/Tub: Health visitor: Standard Prior Function Level of Independence: Independent Driving: Yes Communication Communication: Licensed conveyancer  Overall Cognitive Status: Appears within functional limits for tasks assessed/performed Arousal/Alertness: Awake/alert Orientation Level: Appears intact for tasks assessed Behavior During Session: [facility] for tasks performed    Extremity/Trunk Assessment Right Upper Extremity Assessment RUE ROM/Strength/Tone: [facility] for tasks assessed Left Upper Extremity Assessment LUE ROM/Strength/Tone: WFL for tasks assessed   Mobility  Transfers Sit to Stand: 4: Min assist;With upper extremity assist;From bed .    Exercise    Balance    End of Session OT - End of Session Activity Tolerance: Patient tolerated treatment well Patient left: in chair;with call bell/phone within reach;with family/visitor present  GO     Desha Bitner 04/17/2012, 10:15 AM Marica Otter, OTR/L [phone] 04/17/2012

## 2012-04-17 NOTE — Discharge Summary (Signed)
DISCHARGE SUMMARY  Emily [name]  MR#: [id number]  DOB:06/08/1919  Date of Admission: 04/15/2012 Date of Discharge: 04/17/2012  Attending Physician:Ashlen Kiger,W DOUGLAS  Patient's NWG:[id number],[id number] A, MD  Consults: None indicated  Discharge Diagnoses: Principal Problem:  *Pelvic fracture Active Problems:  Pain  HTN (hypertension)  Osteoporosis  Past Medical History  Diagnosis Date  . Hypertension   . Cataract   . Cancer     right breast cancer  . IBS (irritable bowel syndrome)   . PONV (postoperative nausea and vomiting)   . Clavicle fracture     hx of in approx. 2010   Past Surgical History  Procedure Date  . Cataract extraction   . Abdominal hysterectomy   . Breast surgery     R breast lumpectomy - cancer  . Hemorrhoid surgery     Discharge Medications: Medication List  As of 04/17/2012  7:52 AM   TAKE these medications         acetaminophen 325 MG tablet   Commonly known as: TYLENOL   Take 2 tablets (650 mg total) by mouth every 6 (six) hours as needed (or Fever >/= 101).      calcitonin (salmon) 200 UNIT/ACT nasal spray   Commonly known as: MIACALCIN/FORTICAL   1 spray in alternating nasal passage daily      Difluprednate 0.05 % Emul   Place 1 drop into the left eye every morning. Per patient, this eye drop is supposed to stop on Monday 8/19.      HYDROcodone-acetaminophen 5-325 MG per tablet   Commonly known as: NORCO/VICODIN   Take 1-2 tablets by mouth every 4 (four) hours as needed.      metoprolol tartrate 25 MG tablet   Commonly known as: LOPRESSOR   Take 25 mg by mouth 2 (two) times daily.      nepafenac 0.1 % ophthalmic suspension   Commonly known as: NEVANAC   Place 1 drop into the left eye 3 (three) times daily.            Hospital Procedures: Dg Hip Complete Left 04/15/2012  *RADIOLOGY REPORT*  Clinical Data: Larey Seat, left hip pain  LEFT HIP - COMPLETE 2+ VIEW  Comparison: None.  Findings: Both hips are in normal position with only mild  to moderate degenerative joint disease for age.  No acute fracture is seen.  However, there are acute fractures of both the left superior and inferior pelvic ramus.  The right ramus is intact.  The SI joints appear normal.  There are degenerative changes in the lower lumbar spine.  IMPRESSION:  1.  Acute nondisplaced fractures of the left superior and inferior pelvic ramus. 2.  No hip fracture.  Original Report Authenticated By: Juline Patch, M.D.   Portable Chest 1 View 04/15/2012  *RADIOLOGY REPORT*  Clinical Data: Hypoxia.  Kyphosis.  PORTABLE CHEST - 1 VIEW  Comparison: 06/19/2008.  Findings: Emphysema is severe.  No pneumothorax.  Osteopenia. Right axillary dissection clips.  No airspace disease or effusion.  The pulmonary vascular congestion is present with interstitial pulmonary edema.  Mitral annular calcification is present.  Interlobular septal thickening is present in the periphery.  Subsegmental atelectasis present in the left mid and lower lung. The cardiopericardial silhouette appears similar to prior exam, within normal limits for projection.  IMPRESSION: 1.  Severe emphysema. 2.  Pulmonary vascular congestion and interstitial pulmonary edema.  Original Report Authenticated By: Andreas Newport, M.D.    History of Present Illness (from Dr. Ferd Hibbs admission H&P): 78  yo Elderly F from home, tripped and fell. No LOC, no blood-thinners. Landed on hand and left hip, no head injury. Pt reports pain at Left hip and mid back. Alert, oriented. Full ROM on all extremities. Acute nondisplaced fractures of the left superior and inferior pelvic ramus. 2. No hip fracture. Will admit for pain control and possibly needing SNF until recovered. Currently on O2 and a little sleepy from the MSO4 that she has been getting for her pain. Tripped on 40 yr old great Granddaughter.   Hospital Course: Ms. Matthew was admitted to a medical bed for acute pain management related to her pelvic fractures. Initially, she  required morphine IV for pain control. Miacalcin nasal spray was added. She was transitioned to oral Tylenol and Vicodin with adequate pain control. She was able to ambulate with a walker to the restroom with mild to moderate pain. She was evaluated by physical therapy who recommended home durable medical equipment to include rolling walker (which she has),  3 in 1 bedside commode, and wheelchair (which she has access to). She has 24-hour assistance at home with two sons and their families. She prefers to be discharged home and arrangements are being made for home health physical therapy/occupational therapy and RN. She will need followup for consideration of additional osteoporosis treatment as she has been on a "drug holiday" after prolonged use of 78 Fosamax. Fall precautions have been emphasized.  Day of Discharge Exam BP 146/68  Pulse 80  Temp 98.4 F (36.9 C) (Oral)  Resp 16  Ht 4\' 11"  (1.499 m)  Wt 48.081 kg (106 lb)  BMI 21.41 kg/m2  SpO2 90%  Physical Exam: General appearance: alert, no distress and Appears younger than stated age Eyes: no scleral icterus Throat: oropharynx moist without erythema Resp: clear to auscultation bilaterally Cardio: regular rate and rhythm GI: soft, non-tender; bowel sounds normal; no masses,  no organomegaly Extremities: no clubbing, cyanosis or edema  Discharge Labs:  [facility] 04/16/12 0441 04/15/12 1630  NA 140 140  K 3.8 3.8  CL 104 103  CO2 27 30  GLUCOSE 130* 97  BUN 19 17  CREATININE 0.78 0.75  CALCIUM 9.0 9.7  MG -- --  PHOS -- --   No results found for this basename: AST:2,ALT:2,ALKPHOS:2,BILITOT:2,PROT:2,ALBUMIN:2 in the last 72 hours  Basename 04/16/12 0441 04/15/12 1630  WBC 10.4 13.5*  NEUTROABS -- 11.8*  HGB 11.8* 13.5  HCT 35.4* 41.1  MCV 91.9 91.7  PLT 132* 162    Discharge instructions: Discharge Orders    Future Orders Please Complete By Expires   Diet - low sodium heart healthy      Increase activity slowly       Discharge instructions      Comments:   Weight-bearing as tolerated by pain. Use rolling walker or wheelchair to help prevent falls. Call if pain increases significantly.      Disposition: To home with home health physical therapy/occupational therapy, and RN and 24-hour assistance by her family  Follow-up Appts: Follow-up with Dr. Jacky Kindle at [facility] in 1 week.    Condition on Discharge: Improved/stable  Tests Needing Follow-up: None-need to address osteoporosis management  Signed: Carisma Troupe,W DOUGLAS 04/17/2012, 7:52 AM

## 2012-04-17 NOTE — Progress Notes (Signed)
Physical Therapy Treatment Patient Details Name: Emily [name] MRN: [id number] DOB: 01-May-1919 Today's Date: 04/17/2012 Time: 0910-0930 PT Time Calculation (min): 20 min  PT Assessment / Plan / Recommendation Comments on Treatment Session  Progressing with mobility. Still limited by pain. Plan is for d/c home today with family. Recommend HHPT.     Follow Up Recommendations  Home health PT;Supervision/Assistance - 24 hour    Barriers to Discharge        Equipment Recommendations  None recommended by PT (pt has all DME and access to wheelchair)    Recommendations for Other Services    Frequency Min 3X/week   Plan Discharge plan remains appropriate    Precautions / Restrictions Precautions Precautions: Fall Restrictions Weight Bearing Restrictions: No   Pertinent Vitals/Pain     Mobility  Bed Mobility Bed Mobility: Supine to Sit Supine to Sit: 4: Min assist;HOB elevated Details for Bed Mobility Assistance: VCs safety, technique. Increased time. Pt prefers to have both LEs moved at same time. Assist for bil LEs and trunk to upright Transfers Transfers: Sit to Stand;Stand to Sit Sit to Stand: 4: Min assist;With upper extremity assist;From bed;From chair/3-in-1 Stand to Sit: 4: Min assist;With upper extremity assist;To chair/3-in-1 Details for Transfer Assistance: x2. VCs safety, technique, hand placement. Assist to rise, stabilize, control descent.  Ambulation/Gait Ambulation/Gait Assistance: 4: Min assist Ambulation Distance (Feet): 15 Feet (x 2. ) Ambulation/Gait Assistance Details: VCs safety, technique, sequence. Assist to stabilize throughout ambulation. Increased pain with ambulation Gait Pattern: Step-through pattern;Trunk flexed;Antalgic;Decreased stride length;Decreased step length - right;Decreased step length - left    Exercises     PT Diagnosis:    PT Problem List:   PT Treatment Interventions:     PT Goals Acute Rehab PT Goals Pt will go Supine/Side to  Sit: with supervision PT Goal: Supine/Side to Sit - Progress: Progressing toward goal Pt will go Sit to Stand: with supervision PT Goal: Sit to Stand - Progress: Progressing toward goal Pt will Ambulate: 16 - 50 feet;with supervision;with rolling walker PT Goal: Ambulate - Progress: Progressing toward goal  Visit Information  Last PT Received On: 04/17/12 Assistance Needed: +1    Subjective Data  Subjective: "it hurts" Patient Stated Goal: Less pain. Home   Cognition  Overall Cognitive Status: Appears within functional limits for tasks assessed/performed Arousal/Alertness: Awake/alert Orientation Level: Appears intact for tasks assessed Behavior During Session: [facility] for tasks performed    Balance     End of Session PT - End of Session Equipment Utilized During Treatment: Gait belt Activity Tolerance: Patient limited by pain Patient left: in bed;with family/visitor present;with call bell/phone within reach (sitting EOB)   GP     Rebeca Alert [facility] 04/17/2012, 9:39 AM [phone]

## 2012-04-29 ENCOUNTER — Emergency Department (HOSPITAL_COMMUNITY)
Admission: EM | Admit: 2012-04-29 | Discharge: 2012-04-30 | Disposition: A | Discharge status: Home / Self Care | Payer: Medicare Other | Attending: Emergency Medicine | Admitting: Emergency Medicine

## 2012-04-29 ENCOUNTER — Encounter (HOSPITAL_COMMUNITY): Payer: Self-pay | Admitting: Emergency Medicine

## 2012-04-29 DIAGNOSIS — I10 Essential (primary) hypertension: Secondary | ICD-10-CM | POA: Insufficient documentation

## 2012-04-29 DIAGNOSIS — K589 Irritable bowel syndrome without diarrhea: Secondary | ICD-10-CM | POA: Insufficient documentation

## 2012-04-29 DIAGNOSIS — M7989 Other specified soft tissue disorders: Secondary | ICD-10-CM

## 2012-04-29 DIAGNOSIS — Z853 Personal history of malignant neoplasm of breast: Secondary | ICD-10-CM | POA: Insufficient documentation

## 2012-04-29 MED ORDER — ENOXAPARIN SODIUM 60 MG/0.6ML ~~LOC~~ SOLN
50.0000 mg | Freq: Once | SUBCUTANEOUS | Status: AC
  Administered 2012-04-29: 50 mg via SUBCUTANEOUS
  Filled 2012-04-29: qty 0.6

## 2012-04-29 NOTE — ED Notes (Signed)
Pt reports fracturing her pelvis about two weeks ago.  About a week ago pt reports noticing swelling and a contusion forming on her left foot.  Pt reports not having any pain until this morning. Pt rates pain 5/10.  Pulses palpated, full ROM noted in left foot.

## 2012-04-29 NOTE — ED Notes (Signed)
Pt states she fell two weeks ago and fx her pelvis  Pt was seen here at [facility] states since the fall her left foot has continued to swell  Pt states today she started c/o pain in her left foot and in the back of her left lower leg  Pt's family called the home health nurse and she recommended to bring pt here for eval  Pt has swelling and bruising noted to her left foot

## 2012-04-29 NOTE — ED Provider Notes (Signed)
History     CSN: [id number]  Arrival date & time 04/29/12  2045   First MD Initiated Contact with Patient 04/29/12 2305      Chief Complaint  Patient presents with  . Foot Pain     The history is provided by the patient.   the patient had a fall 2 weeks ago resulting in left superior and inferior pubic rami fracture.  The patient presents to the emergency department now because of worsening swelling in her left lower extremity for the past week.  She's continued to ambulate on her left lower extremity without difficulty.  There has been some new bruising over the last week throughout her left medial thigh as well as her left medial tib-fib.  She denies weakness or numbness of her left lower extremity.  She's had no fevers or chills.  She denies chest pain or shortness of breath.  No prior history of DVT or pulmonary wasn't.  Pain is 0/10 at this time.  Past Medical History  Diagnosis Date  . Hypertension   . Cataract   . Cancer     right breast cancer  . IBS (irritable bowel syndrome)   . PONV (postoperative nausea and vomiting)   . Clavicle fracture     hx of in approx. 2010    Past Surgical History  Procedure Date  . Cataract extraction   . Abdominal hysterectomy   . Breast surgery     R breast lumpectomy - cancer  . Hemorrhoid surgery     History reviewed. No pertinent family history.  History  Substance Use Topics  . Smoking status: Never Smoker   . Smokeless tobacco: Never Used  . Alcohol Use: No    OB History    Grav Para Term Preterm Abortions TAB SAB Ect Mult Living                  Review of Systems  All other systems reviewed and are negative.    Allergies  Penicillins  Home Medications   Current Outpatient Rx  Name Route Sig Dispense Refill  . ACETAMINOPHEN 325 MG PO TABS Oral Take 2 tablets (650 mg total) by mouth every 6 (six) hours as needed (or Fever >/= 101).    . ASPIRIN 325 MG PO TABS Oral Take 325 mg by mouth every 4 (four) hours as  needed. Pain    . CALCITONIN (SALMON) 200 UNIT/ACT NA SOLN  1 spray in alternating nasal passage daily 3.7 mL 3  . HYDROCODONE-ACETAMINOPHEN 5-325 MG PO TABS Oral Take 1-2 tablets by mouth 2 (two) times daily. 1 tablet in the morning and two tablets at bedtime    . METOPROLOL TARTRATE 25 MG PO TABS Oral Take 25 mg by mouth 2 (two) times daily.      BP 146/54  Pulse 91  Temp 98 F (36.7 C) (Oral)  Resp 24  Ht 4\' 11"  (1.499 m)  Wt 106 lb (48.081 kg)  BMI 21.41 kg/m2  SpO2 97%  Physical Exam  Nursing note and vitals reviewed. Constitutional: She is oriented to person, place, and time. She appears well-developed and well-nourished. No distress.  HENT:  Head: Normocephalic and atraumatic.  Eyes: EOM are normal.  Neck: Normal range of motion.  Cardiovascular: Normal rate, regular rhythm and normal heart sounds.   Pulmonary/Chest: Effort normal and breath sounds normal.  Abdominal: Soft. She exhibits no distension. There is no tenderness.  Musculoskeletal: Normal range of motion.  Bruising of the medial aspect of her left thigh and left lower leg.  There is mild swelling of her left lower extremity as compared to right.  Normal pulses in her left foot.  Full range of motion of all major joints in her left lower extremity.  No secondary signs of infection, no erythema or focal tenderness.  No fluctuance.  Neurological: She is alert and oriented to person, place, and time.  Skin: Skin is warm and dry.  Psychiatric: She has a normal mood and affect. Judgment normal.    ED Course  Procedures (including critical care time)  Labs Reviewed - No data to display No results found.   1. Swelling of left lower extremity       MDM  Given the patient's recent pelvic fractures and a left lower extremities swelling this is concerning for deep vein thrombosis.  She has no chest pain shortness of breath to suggest pulmonary embolism.  Her vital signs are normal.  Her pulse ox is 97%.  She  will receive a dose of Lovenox here in the emergency department and she'll be scheduled in the morning for an ultrasound of her left lower extremity to evaluate for DVT.  She understands to return to the ER for any new or worsening symptoms including but not limited to development of chest pain or shortness of breath.        Lyanne Co, MD 04/29/12 [phone]

## 2012-04-30 ENCOUNTER — Ambulatory Visit (HOSPITAL_COMMUNITY)
Admission: RE | Admit: 2012-04-30 | Discharge: 2012-04-30 | Disposition: A | Discharge status: Home / Self Care | Payer: Medicare Other | Source: Ambulatory Visit | Attending: Emergency Medicine | Admitting: Emergency Medicine

## 2012-04-30 DIAGNOSIS — M79609 Pain in unspecified limb: Secondary | ICD-10-CM | POA: Insufficient documentation

## 2012-04-30 DIAGNOSIS — M79606 Pain in leg, unspecified: Secondary | ICD-10-CM

## 2012-04-30 DIAGNOSIS — M7989 Other specified soft tissue disorders: Secondary | ICD-10-CM

## 2012-04-30 NOTE — Progress Notes (Addendum)
*  Preliminary Results* Left lower extremity venous duplex completed. Left lower extremity is positive for deep vein thrombosis involving the left peroneal veins. Discussed preliminary results with RN Diane of Dr. Lanell Matar office, instructed pt to return to office.  04/30/2012 8:22 AM Gertie Fey, RDMS, RDCS

## 2013-07-25 ENCOUNTER — Emergency Department (HOSPITAL_COMMUNITY): Payer: Medicare Other

## 2013-07-25 ENCOUNTER — Inpatient Hospital Stay (HOSPITAL_COMMUNITY)
Admission: EM | Admit: 2013-07-25 | Discharge: 2013-07-28 | DRG: 194 | Disposition: A | Discharge status: Home / Self Care | Payer: Medicare Other | Attending: Internal Medicine | Admitting: Internal Medicine

## 2013-07-25 ENCOUNTER — Encounter (HOSPITAL_COMMUNITY): Payer: Self-pay | Admitting: Emergency Medicine

## 2013-07-25 DIAGNOSIS — Z9849 Cataract extraction status, unspecified eye: Secondary | ICD-10-CM

## 2013-07-25 DIAGNOSIS — I10 Essential (primary) hypertension: Secondary | ICD-10-CM | POA: Diagnosis present

## 2013-07-25 DIAGNOSIS — Z86718 Personal history of other venous thrombosis and embolism: Secondary | ICD-10-CM

## 2013-07-25 DIAGNOSIS — Z23 Encounter for immunization: Secondary | ICD-10-CM

## 2013-07-25 DIAGNOSIS — Z7982 Long term (current) use of aspirin: Secondary | ICD-10-CM

## 2013-07-25 DIAGNOSIS — Z853 Personal history of malignant neoplasm of breast: Secondary | ICD-10-CM

## 2013-07-25 DIAGNOSIS — M810 Age-related osteoporosis without current pathological fracture: Secondary | ICD-10-CM | POA: Diagnosis present

## 2013-07-25 DIAGNOSIS — Z79899 Other long term (current) drug therapy: Secondary | ICD-10-CM

## 2013-07-25 DIAGNOSIS — J189 Pneumonia, unspecified organism: Principal | ICD-10-CM | POA: Diagnosis present

## 2013-07-25 DIAGNOSIS — K589 Irritable bowel syndrome without diarrhea: Secondary | ICD-10-CM | POA: Diagnosis present

## 2013-07-25 DIAGNOSIS — Z66 Do not resuscitate: Secondary | ICD-10-CM | POA: Diagnosis present

## 2013-07-25 DIAGNOSIS — J90 Pleural effusion, not elsewhere classified: Secondary | ICD-10-CM | POA: Diagnosis present

## 2013-07-25 HISTORY — DX: Fracture of unspecified parts of lumbosacral spine and pelvis, initial encounter for closed fracture: S32.9XXA

## 2013-07-25 HISTORY — DX: Acute embolism and thrombosis of unspecified deep veins of left lower extremity: I82.402

## 2013-07-25 HISTORY — DX: Age-related osteoporosis without current pathological fracture: M81.0

## 2013-07-25 HISTORY — DX: Malignant neoplasm of unspecified site of right female breast: C50.911

## 2013-07-25 LAB — CBC WITH DIFFERENTIAL/PLATELET
Eosinophils Absolute: 0.2 10*3/uL (ref 0.0–0.7)
Eosinophils Relative: 3 % (ref 0–5)
HCT: 36.9 % (ref 36.0–46.0)
Lymphocytes Relative: 23 % (ref 12–46)
Lymphs Abs: 1.5 10*3/uL (ref 0.7–4.0)
MCH: 31.8 pg (ref 26.0–34.0)
MCV: 94.6 fL (ref 78.0–100.0)
Monocytes Absolute: 0.6 10*3/uL (ref 0.1–1.0)
Monocytes Relative: 9 % (ref 3–12)
Platelets: 215 10*3/uL (ref 150–400)
RBC: 3.9 MIL/uL (ref 3.87–5.11)
WBC: 6.7 10*3/uL (ref 4.0–10.5)

## 2013-07-25 LAB — COMPREHENSIVE METABOLIC PANEL WITH GFR
ALT: 11 U/L (ref 0–35)
AST: 26 U/L (ref 0–37)
Albumin: 3.2 g/dL — ABNORMAL LOW (ref 3.5–5.2)
Alkaline Phosphatase: 86 U/L (ref 39–117)
BUN: 14 mg/dL (ref 6–23)
CO2: 25 meq/L (ref 19–32)
Calcium: 9.1 mg/dL (ref 8.4–10.5)
Chloride: 104 meq/L (ref 96–112)
Creatinine, Ser: 0.78 mg/dL (ref 0.50–1.10)
GFR calc Af Amer: 82 mL/min — ABNORMAL LOW
GFR calc non Af Amer: 70 mL/min — ABNORMAL LOW
Glucose, Bld: 112 mg/dL — ABNORMAL HIGH (ref 70–99)
Potassium: 3.4 meq/L — ABNORMAL LOW (ref 3.5–5.1)
Sodium: 141 meq/L (ref 135–145)
Total Bilirubin: 0.3 mg/dL (ref 0.3–1.2)
Total Protein: 6.4 g/dL (ref 6.0–8.3)

## 2013-07-25 MED ORDER — LEVOFLOXACIN IN D5W 750 MG/150ML IV SOLN
750.0000 mg | Freq: Once | INTRAVENOUS | Status: AC
  Administered 2013-07-25: 750 mg via INTRAVENOUS
  Filled 2013-07-25: qty 150

## 2013-07-25 MED ORDER — METOPROLOL TARTRATE 25 MG PO TABS
25.0000 mg | ORAL_TABLET | Freq: Once | ORAL | Status: AC
  Administered 2013-07-25: 25 mg via ORAL
  Filled 2013-07-25: qty 1

## 2013-07-25 NOTE — H&P (Signed)
PCP:   Minda Meo, MD   Chief Complaint:  cough  HPI: Mr. Emily [name] is a 79 year old white female with a history of hypertension, prior DVT who presented to the emergency department with the complaint of cough. She reports that she's been having cough with clear sputum production over the past 3 weeks. She was initially seen at an urgent care about 2 weeks ago and prescribed a Z-Pak. With this, her symptoms initially improved. However, over the last several days her symptoms have worsened with increasing cough and mild shortness of breath. She did have a fever initially but this has resolved. She returned to urgent care today due to persistent cough and was told that her chest x-ray showed a worsening pneumonia compared to the prior chest x-ray. She was sent to the emergency department for further evaluation. Here, chest x-ray shows bilateral airspace disease with pleural effusions. She denies any orthopnea, PND dyspnea, or peripheral edema. No hemoptysis or chest pain.  Review of Systems:  Review of Systems - all systems reviewed with patient and are negative as in history of present illness with following exceptions: Mild headachefor 2 days; irritable bowel syndrome; mild right knee pain  Past Medical History: Past Medical History  Diagnosis Date  . Hypertension   . Cataract   . Breast cancer, right breast   . IBS (irritable bowel syndrome)   . PONV (postoperative nausea and vomiting)   . Clavicle fracture     hx of in approx. 2010  . Pelvic fracture     8/13  . Left leg DVT     8/13  . Osteoporosis     Past Surgical History  Procedure Laterality Date  . Cataract extraction    . Abdominal hysterectomy    . Breast surgery      R breast lumpectomy - cancer  . Hemorrhoid surgery      Medications: Prior to Admission medications   Medication Sig Start Date End Date Taking? Authorizing Provider  aspirin 325 MG tablet Take 325 mg by mouth every 4 (four) hours as needed. Pain    Yes Historical Provider, MD  Calcium Carbonate-Vitamin D (CALTRATE 600+D PO) Take 1 tablet by mouth daily.   Yes Historical Provider, MD  Cyanocobalamin (B-12 PO) Take 1 tablet by mouth daily.   Yes Historical Provider, MD  Ginkgo Biloba (GNP GINGKO BILOBA EXTRACT PO) Take 1 tablet by mouth daily.   Yes Historical Provider, MD  guaifenesin (ROBITUSSIN) 100 MG/5ML syrup Take 600 mg by mouth daily as needed for cough.   Yes Historical Provider, MD  HYDROcodone-acetaminophen (NORCO/VICODIN) 5-325 MG per tablet Take 1-2 tablets by mouth 2 (two) times daily. 1 tablet in the morning and two tablets at bedtime   Yes Historical Provider, MD  metoprolol tartrate (LOPRESSOR) 25 MG tablet Take 25 mg by mouth 2 (two) times daily.   Yes Historical Provider, MD  Multiple Vitamin (MULTIVITAMIN WITH MINERALS) TABS tablet Take 1 tablet by mouth daily.   Yes Historical Provider, MD  azithromycin (ZITHROMAX) 250 MG tablet Take 250 mg by mouth See admin instructions. Take 500mg  to start, then 250mg  for 4 days    Historical Provider, MD  influenza vac split quadrivalent PF (FLUARIX) 0.5 ML injection Inject 0.5 mLs into the muscle once.    Historical Provider, MD    Allergies:   Allergies  Allergen Reactions  . 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   Physical Exam: Filed Vitals:   07/25/13 2030 07/25/13 2100 07/25/13 2200 07/25/13 2230  BP: 181/91 176/69 187/69 195/76  Pulse: 73 70 67 72  Temp:      TempSrc:      Resp: 24 18 24 20   Weight:      SpO2: 95% 95% 96% 97%   General appearance: alert and appears younger than stated age Head: Normocephalic, without obvious abnormality, atraumatic Eyes: conjunctivae/corneas clear. PERRL, EOM's intact.  Nose:  Nares normal. Septum midline. Mucosa normal. No drainage or sinus tenderness. Throat: lips, mucosa, and tongue normal; teeth and gums normal Neck: no adenopathy, no carotid bruit, no JVD and thyroid not enlarged, symmetric, no tenderness/mass/nodules Resp: decreased breath sounds bilaterally in the bases with dullness to percussion; no wheezing or rhonchi or crackles Cardio: regular rate and rhythm GI: soft, non-tender; bowel sounds normal; no masses,  no organomegaly Extremities: extremities normal, atraumatic, no cyanosis or edema Pulses: 2+ and symmetric Lymph nodes: no cervical lymphadenopathy Neurologic: Alert and oriented X 3, normal strength and tone. Normal symmetric reflexes.     Labs on Admission:   Recent Labs  07/25/13 1956  NA 141  K 3.4*  CL 104  CO2 25  GLUCOSE 112*  BUN 14  CREATININE 0.78  CALCIUM 9.1    Recent Labs  07/25/13 1956  AST 26  ALT 11  ALKPHOS 86  BILITOT 0.3  PROT 6.4  ALBUMIN 3.2*   No results found for this basename: LIPASE, AMYLASE,  in the last 72 hours  Recent Labs  07/25/13 1956  WBC 6.7  NEUTROABS 4.4  HGB 12.4  HCT 36.9  MCV 94.6  PLT 215    Radiological Exams on Admission: Dg Chest 2 View  07/25/2013   CLINICAL DATA:  Followup pneumonia, productive cough  EXAM: CHEST  2 VIEW  COMPARISON:  06/20/2013  FINDINGS: Heart size roughly normal. Heart borders are obscured by a bilateral moderate pleural effusions with extensive underlying consolidation. Vascular pattern within normal limits.  IMPRESSION: New from the prior study, there are moderate bilateral effusions with extensive underlying airspace disease.   Electronically Signed   By: Esperanza Heir M.D.   On: 07/25/2013 21:57   Orders placed during the hospital encounter of 07/25/13  . EKG 12-LEAD  . EKG 12-LEAD    Assessment/Plan Principal Problem: 1. Bilateral pneumonia with Pleural effusion, bilateral- She is afebrile with no leukocytosis but chest x-ray is  consistent with bilateral pneumonia with associated pleural effusion. Oxygen level is satisfactory in the low 90s. Will admit for IV antibiotics and close monitoring of clinical status and chest x-ray given her advanced age. Obtain blood and sputum cultures. No evidence of aspiration. If pleural effusions enlarge or she becomes more hypoxic, consider thoracentesis.    Active Problems: 2. HTN (hypertension)- blood pressure is elevated in the emergency department but generally under better control. We'll continue home metoprolol and monitor. 3. History of DVT-Lovenox for DVT prophylaxis 4. Burnadette Peter is DO NOT RESUSCITATE. I did discuss this with the patient and her family at the bedside. 5. Disposition-anticipate return home in 2-3 days if she is improving with antibiotics.  Ayelet Gruenewald,W DOUGLAS 07/25/2013, 11:28 PM

## 2013-07-25 NOTE — ED Notes (Signed)
Pt states she has a hx of HTN and chron's dx. Pt states she has not been feeling well for the past week, pt states she saw her primary care provider last week and was dx with pneumonia, pt states she finished her antibiotic but still does not feel well. Pt states she followed up with her MD today and was sent to the ED for further evaluation. Pt states she has had 2 chest xrays, the one taken today showed worsening pneumonia from last week's chest xray.

## 2013-07-25 NOTE — ED Provider Notes (Signed)
CSN: [id number]     Arrival date & time 07/25/13  1836 History   First MD Initiated Contact with Patient 07/25/13 2004     Chief Complaint  Patient presents with  . Pneumonia    Patient is a 79 y.o. female presenting with cough. The history is provided by the patient and a relative.  Cough Cough characteristics:  Productive Severity:  Moderate Onset quality:  Gradual Duration:  2 weeks Timing:  Intermittent Progression:  Worsening Chronicity:  Recurrent Worsened by:  Nothing tried Associated symptoms: no chest pain and no fever   Associated symptoms comment:  Fatigue  pt has had cough for over 2 weeks Seen at an urgent care, started on antibiotics and she improved She reports the cough and fatigue has returned She is not currently on antibiotics No CP No SOB No hemoptysis   She went to an urgent care in [place] today who took an xray and told her to go the hospital for pneumonia.  She does not have the records from today's visit at the urgent care  Past Medical History  Diagnosis Date  . Hypertension   . Cataract   . Cancer     right breast cancer  . IBS (irritable bowel syndrome)   . PONV (postoperative nausea and vomiting)   . Clavicle fracture     hx of in approx. 2010   Past Surgical History  Procedure Laterality Date  . Cataract extraction    . Abdominal hysterectomy    . Breast surgery      R breast lumpectomy - cancer  . Hemorrhoid surgery     No family history on file. History  Substance Use Topics  . Smoking status: Never Smoker   . Smokeless tobacco: Never Used  . Alcohol Use: No   OB History   Grav Para Term Preterm Abortions TAB SAB Ect Mult Living                 Review of Systems  Constitutional: Positive for fatigue. Negative for fever.  Respiratory: Positive for cough.   Cardiovascular: Negative for chest pain.  Neurological: Positive for weakness.  All other systems reviewed and are negative.    Allergies  Penicillins  Home  Medications   Current Outpatient Rx  Name  Route  Sig  Dispense  Refill  . aspirin 325 MG tablet   Oral   Take 325 mg by mouth every 4 (four) hours as needed. Pain         . Calcium Carbonate-Vitamin D (CALTRATE 600+D PO)   Oral   Take 1 tablet by mouth daily.         . Cyanocobalamin (B-12 PO)   Oral   Take 1 tablet by mouth daily.         . Ginkgo Biloba (GNP GINGKO BILOBA EXTRACT PO)   Oral   Take 1 tablet by mouth daily.         Marland Kitchen guaifenesin (ROBITUSSIN) 100 MG/5ML syrup   Oral   Take 600 mg by mouth daily as needed for cough.         Marland Kitchen HYDROcodone-acetaminophen (NORCO/VICODIN) 5-325 MG per tablet   Oral   Take 1-2 tablets by mouth 2 (two) times daily. 1 tablet in the morning and two tablets at bedtime         . metoprolol tartrate (LOPRESSOR) 25 MG tablet   Oral   Take 25 mg by mouth 2 (two) times daily.         Marland Kitchen  Multiple Vitamin (MULTIVITAMIN WITH MINERALS) TABS tablet   Oral   Take 1 tablet by mouth daily.         Marland Kitchen azithromycin (ZITHROMAX) 250 MG tablet   Oral   Take 250 mg by mouth See admin instructions. Take 500mg  to start, then 250mg  for 4 days         . influenza vac split quadrivalent PF (FLUARIX) 0.5 ML injection   Intramuscular   Inject 0.5 mLs into the muscle once.          BP 176/69  Pulse 70  Temp(Src) 97.8 F (36.6 C) (Oral)  Resp 18  Wt 104 lb (47.174 kg)  SpO2 95% Physical Exam CONSTITUTIONAL: Well developed/well nourished HEAD: Normocephalic/atraumatic EYES: EOMI/PERRL ENMT: Mucous membranes moist NECK: supple no meningeal signs SPINE:entire spine nontender CV: S1/S2 noted, no murmurs/rubs/gallops noted LUNGS:coarse BS noted bilaterally, no distress noted, she is able to speak to me clearly ABDOMEN: soft, nontender, no rebound or guarding GU:no cva tenderness NEURO: Pt is awake/alert, moves all extremitiesx4 EXTREMITIES: pulses normal, full ROM SKIN: warm, color normal PSYCH: no abnormalities of mood  noted  ED Course  Procedures (including critical care time) Labs Review Labs Reviewed  COMPREHENSIVE METABOLIC PANEL - Abnormal; Notable for the following:    Potassium 3.4 (*)    Glucose, Bld 112 (*)    Albumin 3.2 (*)    GFR calc non Af Amer 70 (*)    GFR calc Af Amer 82 (*)    All other components within normal limits  CBC WITH DIFFERENTIAL   Imaging Review No results found.  EKG Interpretation    Date/Time:  Saturday July 25 2013 19:43:22 EST Ventricular Rate:  70 PR Interval:  157 QRS Duration: 120 QT Interval:  455 QTC Calculation: 491 R Axis:   79 Text Interpretation:  Sinus rhythm Probable left ventricular hypertrophy Anterior ST elevation, probably due to LVH Borderline prolonged QT interval Baseline wander in lead(s) V6 Confirmed by Bebe Shaggy  MD, Erminie Foulks [phone]) on 07/25/2013 8:07:58 PM            MDM  No diagnosis found. Nursing notes including past medical history and social history reviewed and considered in documentation Labs/vital reviewed and considered  Repeat xray ordered as the images from earlier are not available  11:26 PM Worsening infiltrates noted on xray Pt is requiring oxygen Will admit D/w dr Clelia Croft, on call for dr Jacky Kindle, will admit Patient/family agreeable    Joya Gaskins, MD 07/25/13 2326

## 2013-07-25 NOTE — ED Notes (Signed)
Pt seen a week ago at [facility] last week for c/o cough.  CXR showed beginning pneumonia.  Pt given IM meds and Z pack.  Pt f/u today and CXR showed worsening pneumonia.  Pt sent here for further eval.

## 2013-07-25 NOTE — ED Notes (Signed)
This RN transported pt to xray.

## 2013-07-25 NOTE — ED Notes (Signed)
Pt placed on 2L 02, pt's 02 sats reading 90% on RA. 02 sats reading 96% on 2L 02.

## 2013-07-25 NOTE — ED Notes (Signed)
MD at bedside. 

## 2013-07-26 ENCOUNTER — Encounter (HOSPITAL_COMMUNITY): Payer: Self-pay | Admitting: Internal Medicine

## 2013-07-26 DIAGNOSIS — J90 Pleural effusion, not elsewhere classified: Secondary | ICD-10-CM | POA: Diagnosis present

## 2013-07-26 DIAGNOSIS — J189 Pneumonia, unspecified organism: Secondary | ICD-10-CM | POA: Diagnosis present

## 2013-07-26 LAB — CBC
Hemoglobin: 11.6 g/dL — ABNORMAL LOW (ref 12.0–15.0)
MCH: 31.6 pg (ref 26.0–34.0)
MCV: 95.6 fL (ref 78.0–100.0)
Platelets: 198 10*3/uL (ref 150–400)
RBC: 3.67 MIL/uL — ABNORMAL LOW (ref 3.87–5.11)

## 2013-07-26 LAB — BASIC METABOLIC PANEL
CO2: 27 mEq/L (ref 19–32)
Calcium: 8.5 mg/dL (ref 8.4–10.5)
Creatinine, Ser: 0.72 mg/dL (ref 0.50–1.10)
GFR calc Af Amer: 84 mL/min — ABNORMAL LOW (ref >90)
GFR calc non Af Amer: 72 mL/min — ABNORMAL LOW (ref >90)
Glucose, Bld: 81 mg/dL (ref 70–99)
Potassium: 3.5 mEq/L (ref 3.5–5.1)
Sodium: 141 mEq/L (ref 135–145)

## 2013-07-26 MED ORDER — ACETAMINOPHEN 650 MG RE SUPP: 650.0000 mg | Freq: Four times a day (QID) | RECTAL | Status: DC | PRN

## 2013-07-26 MED ORDER — ENOXAPARIN SODIUM 40 MG/0.4ML ~~LOC~~ SOLN
40.0000 mg | SUBCUTANEOUS | Status: DC
  Administered 2013-07-26 – 2013-07-27 (×2): 40 mg via SUBCUTANEOUS
  Filled 2013-07-26 (×2): qty 0.4

## 2013-07-26 MED ORDER — ALUM & MAG HYDROXIDE-SIMETH 200-200-20 MG/5ML PO SUSP: 30.0000 mL | Freq: Four times a day (QID) | ORAL | Status: DC | PRN

## 2013-07-26 MED ORDER — POLYETHYLENE GLYCOL 3350 17 G PO PACK: 17.0000 g | PACK | Freq: Every day | ORAL | Status: DC | PRN

## 2013-07-26 MED ORDER — LEVOFLOXACIN IN D5W 750 MG/150ML IV SOLN
750.0000 mg | INTRAVENOUS | Status: DC
  Administered 2013-07-27: 750 mg via INTRAVENOUS
  Filled 2013-07-26: qty 150

## 2013-07-26 MED ORDER — CALCIUM CARBONATE-VITAMIN D 500-200 MG-UNIT PO TABS
1.0000 | ORAL_TABLET | Freq: Every day | ORAL | Status: DC
  Administered 2013-07-26 – 2013-07-28 (×3): 1 via ORAL
  Filled 2013-07-26 (×4): qty 1

## 2013-07-26 MED ORDER — GUAIFENESIN-DM 100-10 MG/5ML PO SYRP: 5.0000 mL | ORAL_SOLUTION | ORAL | Status: DC | PRN

## 2013-07-26 MED ORDER — ACETAMINOPHEN 325 MG PO TABS: 650.0000 mg | ORAL_TABLET | Freq: Four times a day (QID) | ORAL | Status: DC | PRN

## 2013-07-26 MED ORDER — SODIUM CHLORIDE 0.9 % IV SOLN
INTRAVENOUS | Status: DC
  Administered 2013-07-26 (×2): via INTRAVENOUS

## 2013-07-26 MED ORDER — ASPIRIN 325 MG PO TABS: 325.0000 mg | ORAL_TABLET | ORAL | Status: DC | PRN

## 2013-07-26 MED ORDER — HYDROCODONE-ACETAMINOPHEN 5-325 MG PO TABS
1.0000 | ORAL_TABLET | ORAL | Status: DC | PRN
  Administered 2013-07-26 – 2013-07-27 (×3): 1 via ORAL
  Filled 2013-07-26 (×3): qty 1

## 2013-07-26 MED ORDER — ADULT MULTIVITAMIN W/MINERALS CH
1.0000 | ORAL_TABLET | Freq: Every day | ORAL | Status: DC
  Administered 2013-07-26 – 2013-07-27 (×2): 1 via ORAL
  Filled 2013-07-26 (×3): qty 1

## 2013-07-26 MED ORDER — ONDANSETRON HCL 4 MG PO TABS: 4.0000 mg | ORAL_TABLET | Freq: Four times a day (QID) | ORAL | Status: DC | PRN

## 2013-07-26 MED ORDER — ONDANSETRON HCL 4 MG/2ML IJ SOLN: 4.0000 mg | Freq: Four times a day (QID) | INTRAMUSCULAR | Status: DC | PRN

## 2013-07-26 MED ORDER — CALCIUM CARBONATE-VITAMIN D 600-400 MG-UNIT PO TABS: 600.0000 mg | ORAL_TABLET | Freq: Every day | ORAL | Status: DC

## 2013-07-26 MED ORDER — METOPROLOL TARTRATE 25 MG PO TABS
25.0000 mg | ORAL_TABLET | Freq: Two times a day (BID) | ORAL | Status: DC
  Administered 2013-07-26 – 2013-07-27 (×4): 25 mg via ORAL
  Filled 2013-07-26 (×7): qty 1

## 2013-07-26 NOTE — ED Notes (Signed)
Attempted to give report 

## 2013-07-26 NOTE — Progress Notes (Signed)
Subjective: Feels better.  Did have a coughing spell while eating.  Clear sputum production.  No increase in shortness of breath.    Objective: Vital signs in last 24 hours: Temp:  [97.8 F (36.6 C)-98.6 F (37 C)] 98.6 F (37 C) (11/23 0700) Pulse Rate:  [67-81] 74 (11/23 0700) Resp:  [14-24] 14 (11/23 0700) BP: (142-196)/(53-91) 142/53 mmHg (11/23 0700) SpO2:  [91 %-98 %] 91 % (11/23 0700) Weight:  [47.174 kg (104 lb)] 47.174 kg (104 lb) (11/22 1840) Weight change:     CBG (last 3)  No results found for this basename: GLUCAP,  in the last 72 hours  Intake/Output from previous day: 11/22 0701 - 11/23 0700 In: 150 [I.V.:150] Out: -  Intake/Output this shift:    General appearance: alert and no distress Eyes: no scleral icterus Throat: oropharynx moist without erythema Resp: scattered rhonchi throughout bilateral lungs most prominent in bilateral bases Cardio: regular rate and rhythm GI: soft, non-tender; bowel sounds normal; no masses,  no organomegaly Extremities: no clubbing, cyanosis or edema   Lab Results:  Recent Labs  07/25/13 1956  NA 141  K 3.4*  CL 104  CO2 25  GLUCOSE 112*  BUN 14  CREATININE 0.78  CALCIUM 9.1    Recent Labs  07/25/13 1956  AST 26  ALT 11  ALKPHOS 86  BILITOT 0.3  PROT 6.4  ALBUMIN 3.2*    Recent Labs  07/25/13 1956 07/26/13 0715  WBC 6.7 5.9  NEUTROABS 4.4  --   HGB 12.4 11.6*  HCT 36.9 35.1*  MCV 94.6 95.6  PLT 215 198   No results found for this basename: INR, PROTIME   No results found for this basename: CKTOTAL, CKMB, CKMBINDEX, TROPONINI,  in the last 72 hours No results found for this basename: TSH, T4TOTAL, FREET3, T3FREE, THYROIDAB,  in the last 72 hours No results found for this basename: VITAMINB12, FOLATE, FERRITIN, TIBC, IRON, RETICCTPCT,  in the last 72 hours  Studies/Results: Dg Chest 2 View  07/25/2013   CLINICAL DATA:  Followup pneumonia, productive cough  EXAM: CHEST  2 VIEW  COMPARISON:   06/20/2013  FINDINGS: Heart size roughly normal. Heart borders are obscured by a bilateral moderate pleural effusions with extensive underlying consolidation. Vascular pattern within normal limits.  IMPRESSION: New from the prior study, there are moderate bilateral effusions with extensive underlying airspace disease.   Electronically Signed   By: Esperanza Heir M.D.   On: 07/25/2013 21:57     Medications: Scheduled: . calcium-vitamin D  1 tablet Oral Q breakfast  . enoxaparin (LOVENOX) injection  40 mg Subcutaneous Q24H  . [START ON 07/27/2013] levofloxacin (LEVAQUIN) IV  750 mg Intravenous Q48H  . metoprolol tartrate  25 mg Oral BID  . multivitamin with minerals  1 tablet Oral Daily   Continuous: . sodium chloride 50 mL/hr at 07/26/13 0153    Assessment/Plan: Principal Problem: 1. Bilateral pneumonia with bilateral pleural effusions- She is afebrile with no leukocytosis but chest x-ray is consistent with bilateral pneumonia with associated pleural effusion. Continue IV Levaquin.  Speech therapy evaluation to rule out aspiration.  Wean O2 as tolerated.  Consider repeat CXR tomorrow (not ordered) to evaluate effusions-If pleural effusions enlarge or she becomes more hypoxic, consider thoracentesis.   Active Problems: 2. HTN (hypertension)- improved.  Continue home medications. 3. History of DVT-Lovenox for DVT prophylaxis  4. Burnadette Peter is DO NOT RESUSCITATE. I did discuss this with the patient and her family at the bedside.  5. Disposition-anticipate return home in 1-2 days if she is improving with antibiotics.   LOS: 1 day   SHAW,W DOUGLAS 07/26/2013, 8:19 AM

## 2013-07-27 ENCOUNTER — Inpatient Hospital Stay (HOSPITAL_COMMUNITY): Payer: Medicare Other

## 2013-07-27 LAB — CBC
HCT: 34.4 % — ABNORMAL LOW (ref 36.0–46.0)
MCH: 31.4 pg (ref 26.0–34.0)
MCHC: 32.3 g/dL (ref 30.0–36.0)
MCV: 97.5 fL (ref 78.0–100.0)
Platelets: 187 10*3/uL (ref 150–400)
RBC: 3.53 MIL/uL — ABNORMAL LOW (ref 3.87–5.11)
WBC: 5.7 10*3/uL (ref 4.0–10.5)

## 2013-07-27 LAB — BASIC METABOLIC PANEL
BUN: 12 mg/dL (ref 6–23)
CO2: 27 mEq/L (ref 19–32)
Calcium: 8.5 mg/dL (ref 8.4–10.5)
Chloride: 107 mEq/L (ref 96–112)
Creatinine, Ser: 0.9 mg/dL (ref 0.50–1.10)
Glucose, Bld: 90 mg/dL (ref 70–99)
Sodium: 142 mEq/L (ref 135–145)

## 2013-07-27 MED ORDER — ENOXAPARIN SODIUM 30 MG/0.3ML ~~LOC~~ SOLN
30.0000 mg | SUBCUTANEOUS | Status: DC
  Filled 2013-07-27: qty 0.3

## 2013-07-27 MED ORDER — FUROSEMIDE 10 MG/ML IJ SOLN
20.0000 mg | Freq: Once | INTRAMUSCULAR | Status: AC
  Administered 2013-07-27: 20 mg via INTRAVENOUS

## 2013-07-27 NOTE — Evaluation (Signed)
Clinical/Bedside Swallow Evaluation Patient Details  Name: Emily [name] MRN: [id number] Date of Birth: April 30, 1919  Today's Date: 07/27/2013 Time: 1301-1330 SLP Time Calculation (min): 29 min  Past Medical History:  Past Medical History  Diagnosis Date  . Hypertension   . Cataract   . Breast cancer, right breast   . IBS (irritable bowel syndrome)   . PONV (postoperative nausea and vomiting)   . Clavicle fracture     hx of in approx. 2010  . Pelvic fracture     8/13  . Left leg DVT     8/13  . Osteoporosis    Past Surgical History:  Past Surgical History  Procedure Laterality Date  . Cataract extraction    . Abdominal hysterectomy    . Breast surgery      R breast lumpectomy - cancer  . Hemorrhoid surgery     HPI:  79 year old white female with a history of hypertension, prior DVT who presented to the emergency department with the complaint of cough. She reports that she's been having cough with clear sputum production over the past 3 weeks. She was initially seen at an urgent care about 2 weeks ago and prescribed a Z-Pak. With this, her symptoms initially improved. However, over the last several days her symptoms have worsened with increasing cough and mild shortness of breath. She did have a fever initially but this has resolved. She returned to urgent care today due to persistent cough and was told that her chest x-ray showed a worsening pneumonia compared to the prior chest x-ray. She was sent to the emergency department for further evaluation. Here, chest x-ray shows bilateral airspace disease with pleural effusions.   Assessment / Plan / Recommendation Clinical Impression  Pt presents with normal oropharyngeal swallow function marked by active mastication, swift swallow trigger, and no observed s/s of aspiration despite being taxed with large, successive boluses of thin liquid.  Pt demonstrates adequate respiratory/swallow sequencing with appropriate exhalation  post-swallow.  Doubt prandial aspiration as contributor to pna.  Rec continued regular diet, thin liquids    Aspiration Risk  Mild    Diet Recommendation Regular;Thin liquid   Liquid Administration via: Cup;Straw Medication Administration: Whole meds with puree Supervision: Patient able to self feed    Other  Recommendations Oral Care Recommendations: Oral care BID   Follow Up Recommendations  None           SLP Swallow Goals   n/a  Swallow Study Prior Functional Status       General Date of Onset: 07/27/13 Type of Study: Bedside swallow evaluation Previous Swallow Assessment: none per records Diet Prior to this Study: Regular;Thin liquids Temperature Spikes Noted: No Respiratory Status: Nasal cannula History of Recent Intubation: No Behavior/Cognition: Alert;Cooperative;Pleasant mood Oral Cavity - Dentition: Adequate natural dentition Self-Feeding Abilities: Able to feed self Patient Positioning: Upright in chair Baseline Vocal Quality: Clear Volitional Cough: Strong Volitional Swallow: Able to elicit    Oral/Motor/Sensory Function Overall Oral Motor/Sensory Function: Appears within functional limits for tasks assessed   Ice Chips Ice chips: Not tested   Thin Liquid Thin Liquid: Within functional limits Presentation: Self Fed;Straw;Cup    Nectar Thick Nectar Thick Liquid: Not tested   Honey Thick Honey Thick Liquid: Not tested   Puree Puree: Within functional limits   Solid   Jorje Vanatta L. [place], [place] CCC/SLP Pager [phone]     Solid: Within functional limits       Blenda Mounts Laurice 07/27/2013,1:44 PM

## 2013-07-27 NOTE — Progress Notes (Signed)
Subjective: Up, eating, better per son and patient  Objective: Vital signs in last 24 hours: Temp:  [98.1 F (36.7 C)-98.8 F (37.1 C)] 98.6 F (37 C) (11/24 1400) Pulse Rate:  [67-82] 69 (11/24 1400) Resp:  [18-20] 20 (11/24 1400) BP: (153-176)/(61-66) 153/61 mmHg (11/24 1400) SpO2:  [93 %-96 %] 96 % (11/24 1400) Weight change:   CBG (last 3)  No results found for this basename: GLUCAP,  in the last 72 hours  Intake/Output from previous day: 11/23 0701 - 11/24 0700 In: 845.8 [P.O.:240; I.V.:605.8] Out: -   Physical Exam:  General appearance: alert and no distress  Eyes: no scleral icterus  Throat: oropharynx moist without erythema  Resp: clear, diminished at bases Cardio: regular rate and rhythm  GI: soft, non-tender; bowel sounds normal; no masses, no organomegaly  Extremities: no clubbing, cyanosis or edema   Lab Results:  Recent Labs  07/26/13 0715 07/27/13 0310  NA 141 142  K 3.5 3.4*  CL 105 107  CO2 27 27  GLUCOSE 81 90  BUN 12 12  CREATININE 0.72 0.90  CALCIUM 8.5 8.5    Recent Labs  07/25/13 1956  AST 26  ALT 11  ALKPHOS 86  BILITOT 0.3  PROT 6.4  ALBUMIN 3.2*    Recent Labs  07/25/13 1956 07/26/13 0715 07/27/13 0310  WBC 6.7 5.9 5.7  NEUTROABS 4.4  --   --   HGB 12.4 11.6* 11.1*  HCT 36.9 35.1* 34.4*  MCV 94.6 95.6 97.5  PLT 215 198 187   No results found for this basename: INR, PROTIME   No results found for this basename: CKTOTAL, CKMB, CKMBINDEX, TROPONINI,  in the last 72 hours No results found for this basename: TSH, T4TOTAL, FREET3, T3FREE, THYROIDAB,  in the last 72 hours No results found for this basename: VITAMINB12, FOLATE, FERRITIN, TIBC, IRON, RETICCTPCT,  in the last 72 hours  Studies/Results: Dg Chest 2 View  07/27/2013   CLINICAL DATA:  History of hypertension and weakness and symptoms suggestive of pneumonia.  EXAM: CHEST  2 VIEW  COMPARISON:  November 22nd, 2014.  FINDINGS: The lungs are better inflated  today. There is a persistent small right pleural effusion and moderate size left pleural effusion blunting the costophrenic angles. The cardiac silhouette is normal in size. The central pulmonary vascularity is prominent. There is soft tissue density in the perihilar regions bilaterally which is more conspicuous than in the past and may reflect interstitial edema or developing pneumonia. There surgical clips in the right axillary region. There is prominent thoracic kyphosis.  IMPRESSION: Bilateral pleural effusions persist. Increased density in the perihilar regions is consistent with mild interstitial edema or pneumonia. Overall the appearance of the pulmonary interstitium has deteriorated somewhat since the earlier study. The pleural effusions are not greatly changed.   Electronically Signed   By: David  [place]   On: 07/27/2013 08:47   Dg Chest 2 View  07/25/2013   CLINICAL DATA:  Followup pneumonia, productive cough  EXAM: CHEST  2 VIEW  COMPARISON:  06/20/2013  FINDINGS: Heart size roughly normal. Heart borders are obscured by a bilateral moderate pleural effusions with extensive underlying consolidation. Vascular pattern within normal limits.  IMPRESSION: New from the prior study, there are moderate bilateral effusions with extensive underlying airspace disease.   Electronically Signed   By: Esperanza Heir M.D.   On: 07/25/2013 21:57     Assessment/Plan: 1. Bilateral pneumonia- clinically better, check cxray 2. HTN- stable  LOS: 2 days  Arling Cerone A 07/27/2013, 5:22 PM

## 2013-07-28 LAB — CBC
HCT: 35.3 % — ABNORMAL LOW (ref 36.0–46.0)
MCH: 32.3 pg (ref 26.0–34.0)
MCHC: 34 g/dL (ref 30.0–36.0)
MCV: 94.9 fL (ref 78.0–100.0)
RBC: 3.72 MIL/uL — ABNORMAL LOW (ref 3.87–5.11)
RDW: 13.7 % (ref 11.5–15.5)

## 2013-07-28 LAB — BASIC METABOLIC PANEL
BUN: 11 mg/dL (ref 6–23)
CO2: 29 mEq/L (ref 19–32)
Calcium: 8.6 mg/dL (ref 8.4–10.5)
Chloride: 105 mEq/L (ref 96–112)
Creatinine, Ser: 0.76 mg/dL (ref 0.50–1.10)
Sodium: 142 mEq/L (ref 135–145)

## 2013-07-28 MED ORDER — LEVOFLOXACIN 500 MG PO TABS: 500.0000 mg | ORAL_TABLET | Freq: Every day | ORAL | Status: DC

## 2013-07-28 MED ORDER — POTASSIUM CHLORIDE ER 10 MEQ PO TBCR: 10.0000 meq | EXTENDED_RELEASE_TABLET | Freq: Every day | ORAL | Status: DC

## 2013-07-28 MED ORDER — FUROSEMIDE 20 MG PO TABS: 20.0000 mg | ORAL_TABLET | Freq: Every day | ORAL | Status: AC

## 2013-07-28 NOTE — Discharge Planning (Signed)
DISCHARGE SUMMARY  Emily [name]  MR#: [id number]  DOB:10/10/1918  Date of Admission: 07/25/2013 Date of Discharge: 07/28/2013  Attending Physician:Emily [name]  Patient's Emily A, MD  Consults: non3e  Discharge Diagnoses: Principal Problem:   Bilateral pneumonia Active Problems:   HTN (hypertension)   Pleural effusion, bilateral   Discharge Medications:   Medication List    STOP taking these medications       azithromycin 250 MG tablet  Commonly known as:  ZITHROMAX     CALTRATE 600+D PO      TAKE these medications       aspirin 325 MG tablet  Take 325 mg by mouth every 4 (four) hours as needed. Pain     B-12 PO  Take 1 tablet by mouth daily.     furosemide 20 MG tablet  Commonly known as:  LASIX  Take 1 tablet (20 mg total) by mouth daily.     GNP GINGKO BILOBA EXTRACT PO  Take 1 tablet by mouth daily.     guaifenesin 100 MG/5ML syrup  Commonly known as:  ROBITUSSIN  Take 600 mg by mouth daily as needed for cough.     HYDROcodone-acetaminophen 5-325 MG per tablet  Commonly known as:  NORCO/VICODIN  Take 1-2 tablets by mouth 2 (two) times daily. 1 tablet in the morning and two tablets at bedtime     influenza vac split quadrivalent PF 0.5 ML injection  Commonly known as:  FLUARIX  Inject 0.5 mLs into the muscle once.     levofloxacin 500 MG tablet  Commonly known as:  LEVAQUIN  Take 1 tablet (500 mg total) by mouth daily.     metoprolol tartrate 25 MG tablet  Commonly known as:  LOPRESSOR  Take 25 mg by mouth 2 (two) times daily.     multivitamin with minerals Tabs tablet  Take 1 tablet by mouth daily.     potassium chloride 10 MEQ tablet  Commonly known as:  K-DUR  Take 1 tablet (10 mEq total) by mouth daily.        Hospital Procedures: Dg Chest 2 View  07/27/2013   CLINICAL DATA:  History of hypertension and weakness and symptoms suggestive of pneumonia.  EXAM: CHEST  2 VIEW  COMPARISON:  November 22nd, 2014.   FINDINGS: The lungs are better inflated today. There is [name] persistent small right pleural effusion and moderate size left pleural effusion blunting the costophrenic angles. The cardiac silhouette is normal in size. The central pulmonary vascularity is prominent. There is soft tissue density in the perihilar regions bilaterally which is more conspicuous than in the past and may reflect interstitial edema or developing pneumonia. There surgical clips in the right axillary region. There is prominent thoracic kyphosis.  IMPRESSION: Bilateral pleural effusions persist. Increased density in the perihilar regions is consistent with mild interstitial edema or pneumonia. Overall the appearance of the pulmonary interstitium has deteriorated somewhat since the earlier study. The pleural effusions are not greatly changed.   Electronically Signed   By: David  [place]   On: 07/27/2013 08:47   Dg Chest 2 View  07/25/2013   CLINICAL DATA:  Followup pneumonia, productive cough  EXAM: CHEST  2 VIEW  COMPARISON:  06/20/2013  FINDINGS: Heart size roughly normal. Heart borders are obscured by [name] bilateral moderate pleural effusions with extensive underlying consolidation. Vascular pattern within normal limits.  IMPRESSION: New from the prior study, there are moderate bilateral effusions with extensive underlying airspace disease.   Electronically Signed  By: Esperanza Heir M.D.   On: 07/25/2013 21:57    History of Present Illness:  Mr. Emily [name] is [name] 79 year old white female with [name] history of hypertension, prior DVT who presented to the emergency department with the complaint of cough. She reports that she's been having cough with clear sputum production over the past 3 weeks. She was initially seen at an urgent care about 2 weeks ago and prescribed [name] Z-Pak. With this, her symptoms initially improved. However, over the last several days her symptoms have worsened with increasing cough and mild shortness of breath. She did have [name]  fever initially but this has resolved. She returned to urgent care today due to persistent cough and was told that her chest x-ray showed [name] worsening pneumonia compared to the prior chest x-ray. She was sent to the emergency department for further evaluation. Here, chest x-ray shows bilateral airspace disease with pleural effusions. She denies any orthopnea, PND dyspnea, or peripheral edema. No hemoptysis or chest pain.     Hospital Course: Patient was admitted to the medical service placed on IV Levaquin and actually improved fairly dramatically. While clinically she seemed much better chest radiograph seemed [name] bit worse and more compatible with [name] bit of fluid overload. Given her age she was gently diuresed and transition to oral diuretics. On the day of discharge she is breathing well in no distress with clear lung fields. By mouth intake has been excellent. She's had no further cough or sputum. According to she and her son she seems close to baseline. I did explain this remains fragile but we will discharge home on orals to continue in followup chest radiograph in about one week.  Day of Discharge Exam BP 155/69  Pulse 76  Temp(Src) 98.1 F (36.7 C) (Oral)  Resp 18  Ht 4' 11.06" (1.5 m)  Wt 47.174 kg (104 lb)  BMI 20.97 kg/m2  SpO2 91%  Physical Exam: General appearance: alert, cooperative and no distress Eyes: no scleral icterus Throat: oropharynx moist without erythema Resp: clear to auscultation bilaterally Cardio: regular rate and rhythm Extremities: no clubbing, cyanosis or edema No JVD or bruits.  Abdomen soft and nontender. Back is kyphotic. Neurologically heart hearing bright alert conversant nonlateralizing  Discharge Labs:  Recent Labs  07/26/13 0715 07/27/13 0310  NA 141 142  K 3.5 3.4*  CL 105 107  CO2 27 27  GLUCOSE 81 90  BUN 12 12  CREATININE 0.72 0.90  CALCIUM 8.5 8.5    Recent Labs  07/25/13 1956  AST 26  ALT 11  ALKPHOS 86  BILITOT 0.3  PROT  6.4  ALBUMIN 3.2*    Recent Labs  07/25/13 1956 07/26/13 0715 07/27/13 0310  WBC 6.7 5.9 5.7  NEUTROABS 4.4  --   --   HGB 12.4 11.6* 11.1*  HCT 36.9 35.1* 34.4*  MCV 94.6 95.6 97.5  PLT 215 198 187   No results found for this basename: CKTOTAL, CKMB, CKMBINDEX, TROPONINI,  in the last 72 hours No results found for this basename: TSH, T4TOTAL, FREET3, T3FREE, THYROIDAB,  in the last 72 hours No results found for this basename: VITAMINB12, FOLATE, FERRITIN, TIBC, IRON, RETICCTPCT,  in the last 72 hours  Discharge instructions:     Discharge Orders   Future Orders Complete By Expires   Diet - low sodium heart healthy  As directed    Increase activity slowly  As directed       Disposition: home with son  Follow-up Appts:  Follow-up with Dr. Jacky Kindle at [facility] in one week.  Call for appointment.  Condition on Discharge: improved in stable  Tests Needing Follow-up: none  Signed: Kanasia Gayman [name] 07/28/2013, 7:38 AM

## 2014-01-07 ENCOUNTER — Emergency Department (HOSPITAL_COMMUNITY): Payer: Medicare HMO

## 2014-01-07 ENCOUNTER — Encounter (HOSPITAL_COMMUNITY): Payer: Self-pay | Admitting: Emergency Medicine

## 2014-01-07 ENCOUNTER — Emergency Department (HOSPITAL_COMMUNITY)
Admission: EM | Admit: 2014-01-07 | Discharge: 2014-01-08 | Disposition: A | Discharge status: Home / Self Care | Payer: Medicare HMO | Attending: Emergency Medicine | Admitting: Emergency Medicine

## 2014-01-07 DIAGNOSIS — N390 Urinary tract infection, site not specified: Secondary | ICD-10-CM | POA: Insufficient documentation

## 2014-01-07 DIAGNOSIS — Z86718 Personal history of other venous thrombosis and embolism: Secondary | ICD-10-CM | POA: Insufficient documentation

## 2014-01-07 DIAGNOSIS — Z79899 Other long term (current) drug therapy: Secondary | ICD-10-CM | POA: Insufficient documentation

## 2014-01-07 DIAGNOSIS — H269 Unspecified cataract: Secondary | ICD-10-CM | POA: Insufficient documentation

## 2014-01-07 DIAGNOSIS — Z880 Allergy status to penicillin: Secondary | ICD-10-CM | POA: Insufficient documentation

## 2014-01-07 DIAGNOSIS — Z8781 Personal history of (healed) traumatic fracture: Secondary | ICD-10-CM | POA: Insufficient documentation

## 2014-01-07 DIAGNOSIS — C50919 Malignant neoplasm of unspecified site of unspecified female breast: Secondary | ICD-10-CM | POA: Insufficient documentation

## 2014-01-07 DIAGNOSIS — I10 Essential (primary) hypertension: Secondary | ICD-10-CM | POA: Insufficient documentation

## 2014-01-07 DIAGNOSIS — K589 Irritable bowel syndrome without diarrhea: Secondary | ICD-10-CM | POA: Insufficient documentation

## 2014-01-07 DIAGNOSIS — M810 Age-related osteoporosis without current pathological fracture: Secondary | ICD-10-CM | POA: Insufficient documentation

## 2014-01-07 LAB — CBC WITH DIFFERENTIAL/PLATELET
Basophils Absolute: 0 10*3/uL (ref 0.0–0.1)
Basophils Relative: 0 % (ref 0–1)
Eosinophils Absolute: 0 10*3/uL (ref 0.0–0.7)
Eosinophils Relative: 0 % (ref 0–5)
HCT: 34.8 % — ABNORMAL LOW (ref 36.0–46.0)
HEMOGLOBIN: 11.6 g/dL — AB (ref 12.0–15.0)
LYMPHS PCT: 8 % — AB (ref 12–46)
Lymphs Abs: 0.5 10*3/uL — ABNORMAL LOW (ref 0.7–4.0)
MCH: 30.5 pg (ref 26.0–34.0)
MCHC: 33.3 g/dL (ref 30.0–36.0)
MCV: 91.6 fL (ref 78.0–100.0)
MONO ABS: 0.5 10*3/uL (ref 0.1–1.0)
MONOS PCT: 8 % (ref 3–12)
NEUTROS ABS: 5.1 10*3/uL (ref 1.7–7.7)
NEUTROS PCT: 84 % — AB (ref 43–77)
Platelets: 110 10*3/uL — ABNORMAL LOW (ref 150–400)
RBC: 3.8 MIL/uL — AB (ref 3.87–5.11)
RDW: 13.3 % (ref 11.5–15.5)
WBC: 6.1 10*3/uL (ref 4.0–10.5)

## 2014-01-07 LAB — COMPREHENSIVE METABOLIC PANEL
ALBUMIN: 3 g/dL — AB (ref 3.5–5.2)
ALT: 198 U/L — ABNORMAL HIGH (ref 0–35)
AST: 88 U/L — ABNORMAL HIGH (ref 0–37)
Alkaline Phosphatase: 180 U/L — ABNORMAL HIGH (ref 39–117)
BILIRUBIN TOTAL: 0.8 mg/dL (ref 0.3–1.2)
BUN: 13 mg/dL (ref 6–23)
CHLORIDE: 97 meq/L (ref 96–112)
CO2: 27 meq/L (ref 19–32)
CREATININE: 0.73 mg/dL (ref 0.50–1.10)
Calcium: 8.6 mg/dL (ref 8.4–10.5)
GFR calc Af Amer: 83 mL/min — ABNORMAL LOW (ref >90)
GFR, EST NON AFRICAN AMERICAN: 71 mL/min — AB (ref >90)
Glucose, Bld: 113 mg/dL — ABNORMAL HIGH (ref 70–99)
POTASSIUM: 3.4 meq/L — AB (ref 3.7–5.3)
Sodium: 136 mEq/L — ABNORMAL LOW (ref 137–147)
Total Protein: 6.4 g/dL (ref 6.0–8.3)

## 2014-01-07 LAB — I-STAT CG4 LACTIC ACID, ED: LACTIC ACID, VENOUS: 1.61 mmol/L (ref 0.5–2.2)

## 2014-01-07 LAB — URINALYSIS, ROUTINE W REFLEX MICROSCOPIC
GLUCOSE, UA: NEGATIVE mg/dL
KETONES UR: NEGATIVE mg/dL
Nitrite: NEGATIVE
Protein, ur: 30 mg/dL — AB
Specific Gravity, Urine: 1.02 (ref 1.005–1.030)
UROBILINOGEN UA: 1 mg/dL (ref 0.0–1.0)
pH: 5.5 (ref 5.0–8.0)

## 2014-01-07 LAB — URINE MICROSCOPIC-ADD ON

## 2014-01-07 MED ORDER — SODIUM CHLORIDE 0.9 % IV BOLUS (SEPSIS)
500.0000 mL | Freq: Once | INTRAVENOUS | Status: AC
  Administered 2014-01-07: 500 mL via INTRAVENOUS

## 2014-01-07 MED ORDER — DEXTROSE 5 % IV SOLN
1.0000 g | Freq: Once | INTRAVENOUS | Status: AC
  Administered 2014-01-07: 1 g via INTRAVENOUS
  Filled 2014-01-07: qty 10

## 2014-01-07 MED ORDER — ACETAMINOPHEN 325 MG PO TABS
650.0000 mg | ORAL_TABLET | Freq: Four times a day (QID) | ORAL | Status: DC | PRN
  Administered 2014-01-07: 650 mg via ORAL
  Filled 2014-01-07: qty 2

## 2014-01-07 NOTE — ED Provider Notes (Signed)
CSN: [id number]     Arrival date & time 01/07/14  1729 History   First MD Initiated Contact with Patient 01/07/14 2022     Chief Complaint  Patient presents with  . Fever      HPI Patient presents emergency department for generalized weakness of the past several days and fever of 103 on arrival to the ER.  She denies nausea and vomiting.  She denies upper respiratory symptoms.  Denies abdominal pain.  Has had urinary frequency without dysuria.  Family reports that she did eat a good dinner and drank a lot of water this evening.  No altered mental status.  For back pain or flank pain.   Past Medical History  Diagnosis Date  . Hypertension   . Cataract   . Breast cancer, right breast   . IBS (irritable bowel syndrome)   . PONV (postoperative nausea and vomiting)   . Clavicle fracture     hx of in approx. 2010  . Pelvic fracture     8/13  . Left leg DVT     8/13  . Osteoporosis    Past Surgical History  Procedure Laterality Date  . Cataract extraction    . Abdominal hysterectomy    . Breast surgery      R breast lumpectomy - cancer  . Hemorrhoid surgery     No family history on file. History  Substance Use Topics  . Smoking status: Never Smoker   . Smokeless tobacco: Never Used  . Alcohol Use: No   OB History   Grav Para Term Preterm Abortions TAB SAB Ect Mult Living                 Review of Systems  All other systems reviewed and are negative.     Allergies  Penicillins  Home Medications   Prior to Admission medications   Medication Sig Start Date End Date Taking? Authorizing Provider  Cyanocobalamin (B-12 PO) Take 1 tablet by mouth daily.   Yes Historical Provider, MD  furosemide (LASIX) 20 MG tablet Take 1 tablet (20 mg total) by mouth daily. 07/28/13  Yes Geoffery Lyons, MD  metoprolol tartrate (LOPRESSOR) 25 MG tablet Take 25 mg by mouth 2 (two) times daily.   Yes Historical Provider, MD  metoprolol tartrate (LOPRESSOR) 25 MG tablet Take 25 mg by  mouth 2 (two) times daily.   Yes Historical Provider, MD   BP 151/81  Pulse 87  Temp(Src) 97.8 F (36.6 C) (Oral)  Resp 18  Ht 5\' 2"  (1.575 m)  Wt 96 lb (43.545 kg)  BMI 17.55 kg/m2  SpO2 98% Physical Exam  Nursing note and vitals reviewed. Constitutional: She is oriented to person, place, and time. She appears well-developed and well-nourished. No distress.  HENT:  Head: Normocephalic and atraumatic.  Eyes: EOM are normal.  Neck: Normal range of motion.  Cardiovascular: Normal rate, regular rhythm and normal heart sounds.   Pulmonary/Chest: Effort normal and breath sounds normal.  Abdominal: Soft. She exhibits no distension. There is no tenderness.  Musculoskeletal: Normal range of motion.  Neurological: She is alert and oriented to person, place, and time.  Skin: Skin is warm and dry.  Psychiatric: She has a normal mood and affect. Judgment normal.    ED Course  Procedures (including critical care time) Labs Review Labs Reviewed  CBC WITH DIFFERENTIAL - Abnormal; Notable for the following:    RBC 3.80 (*)    Hemoglobin 11.6 (*)    HCT  34.8 (*)    Platelets 110 (*)    Neutrophils Relative % 84 (*)    Lymphocytes Relative 8 (*)    Lymphs Abs 0.5 (*)    All other components within normal limits  COMPREHENSIVE METABOLIC PANEL - Abnormal; Notable for the following:    Sodium 136 (*)    Potassium 3.4 (*)    Glucose, Bld 113 (*)    Albumin 3.0 (*)    AST 88 (*)    ALT 198 (*)    Alkaline Phosphatase 180 (*)    GFR calc non Af Amer 71 (*)    GFR calc Af Amer 83 (*)    All other components within normal limits  URINALYSIS, ROUTINE W REFLEX MICROSCOPIC - Abnormal; Notable for the following:    Color, Urine AMBER (*)    APPearance CLOUDY (*)    Hgb urine dipstick MODERATE (*)    Bilirubin Urine SMALL (*)    Protein, ur 30 (*)    Leukocytes, UA LARGE (*)    All other components within normal limits  URINE MICROSCOPIC-ADD ON - Abnormal; Notable for the following:     Bacteria, UA FEW (*)    All other components within normal limits  CULTURE, BLOOD (ROUTINE X 2)  CULTURE, BLOOD (ROUTINE X 2)  URINE CULTURE  I-STAT CG4 LACTIC ACID, ED    Imaging Review Dg Chest 2 View  01/07/2014   CLINICAL DATA:  Fever  EXAM: CHEST  2 VIEW  COMPARISON:  DG CHEST 2 VIEW dated 07/27/2013  FINDINGS: There are bilateral chronic bronchitic changes. There is no focal parenchymal opacity, pleural effusion, or pneumothorax. The heart and mediastinal contours are unremarkable.  There are surgical clips in the right axilla.  The osseous structures are unremarkable.  IMPRESSION: No active cardiopulmonary disease.   Electronically Signed   By: Kathreen Devoid   On: 01/07/2014 22:55     EKG Interpretation None      MDM   Final diagnoses:  Urinary tract infection    Family states patient is doing much better.  No altered mental status.  Fever likely secondary to urinary tract infection.  Urine culture sent.  Patient and patient's family would prefer to take the patient home.  She is overall well appearing and nontoxic.  Her fever is coming down.  Blood cultures will be added in case she were to worsen over the next several days.  She is very responsive primary care physician and she can follow up closely with him or return to the ER for new or worsening symptoms.  Rocephin in the emergency department.  Home with Keflex.    Hoy Morn, MD 01/08/14 [phone]

## 2014-01-07 NOTE — ED Notes (Signed)
NOTIFIED TRIAGE OF PATIENTS LAB RESULTS OF CG4+ LACTIC ACID TO TRIAGE . PATIENT HAS NOT BEEN ASSIGNED TO A PHYSICIAN ,OR ROOM ,@18 :40 PM ,01/08/2016.

## 2014-01-07 NOTE — ED Notes (Signed)
Patient returned from X-ray 

## 2014-01-07 NOTE — ED Notes (Signed)
Pt sent here from [facility] for fever of 101 and weakness - since yesterday.  Temp 103.1 in triage, though other vs stable.  Pt denies pain, changes in bowel or bladder habits, cough, etc.

## 2014-01-07 NOTE — ED Notes (Signed)
Patient taken to Xray  

## 2014-01-08 MED ORDER — CEPHALEXIN 500 MG PO CAPS: 500.0000 mg | ORAL_CAPSULE | Freq: Four times a day (QID) | ORAL | Status: AC

## 2014-01-08 NOTE — Discharge Instructions (Signed)
Urinary Tract Infection  Urinary tract infections (UTIs) can develop anywhere along your urinary tract. Your urinary tract is your body's drainage system for removing wastes and extra water. Your urinary tract includes two kidneys, two ureters, a bladder, and a urethra. Your kidneys are a pair of bean-shaped organs. Each kidney is about the size of your fist. They are located below your ribs, one on each side of your spine.  CAUSES  Infections are caused by microbes, which are microscopic organisms, including fungi, viruses, and bacteria. These organisms are so small that they can only be seen through a microscope. Bacteria are the microbes that most commonly cause UTIs.  SYMPTOMS   Symptoms of UTIs may vary by age and gender of the patient and by the location of the infection. Symptoms in young women typically include a frequent and intense urge to urinate and a painful, burning feeling in the bladder or urethra during urination. Older women and men are more likely to be tired, shaky, and weak and have muscle aches and abdominal pain. A fever may mean the infection is in your kidneys. Other symptoms of a kidney infection include pain in your back or sides below the ribs, nausea, and vomiting.  DIAGNOSIS  To diagnose a UTI, your caregiver will ask you about your symptoms. Your caregiver also will ask to provide a urine sample. The urine sample will be tested for bacteria and white blood cells. White blood cells are made by your body to help fight infection.  TREATMENT   Typically, UTIs can be treated with medication. Because most UTIs are caused by a bacterial infection, they usually can be treated with the use of antibiotics. The choice of antibiotic and length of treatment depend on your symptoms and the type of bacteria causing your infection.  HOME CARE INSTRUCTIONS   If you were prescribed antibiotics, take them exactly as your caregiver instructs you. Finish the medication even if you feel better after you  have only taken some of the medication.   Drink enough water and fluids to keep your urine clear or pale yellow.   Avoid caffeine, tea, and carbonated beverages. They tend to irritate your bladder.   Empty your bladder often. Avoid holding urine for long periods of time.   Empty your bladder before and after sexual intercourse.   After a bowel movement, women should cleanse from front to back. Use each tissue only once.  SEEK MEDICAL CARE IF:    You have back pain.   You develop a fever.   Your symptoms do not begin to resolve within 3 days.  SEEK IMMEDIATE MEDICAL CARE IF:    You have severe back pain or lower abdominal pain.   You develop chills.   You have nausea or vomiting.   You have continued burning or discomfort with urination.  MAKE SURE YOU:    Understand these instructions.   Will watch your condition.   Will get help right away if you are not doing well or get worse.  Document Released: 05/30/2005 Document Revised: 02/19/2012 Document Reviewed: 09/28/2011  [employer] Patient Information 2014 [employer]

## 2014-01-09 LAB — URINE CULTURE

## 2014-01-14 LAB — CULTURE, BLOOD (ROUTINE X 2)
CULTURE: NO GROWTH
Culture: NO GROWTH

## 2014-08-21 IMAGING — CR DG CHEST 2V
2 series · 2 of 2 positions shown · non-contrast
Comparison: [REDACTED], 1196.

CLINICAL DATA: History of hypertension and weakness and symptoms
suggestive of pneumonia.

EXAM:
CHEST  2 VIEW

[w chest lat]
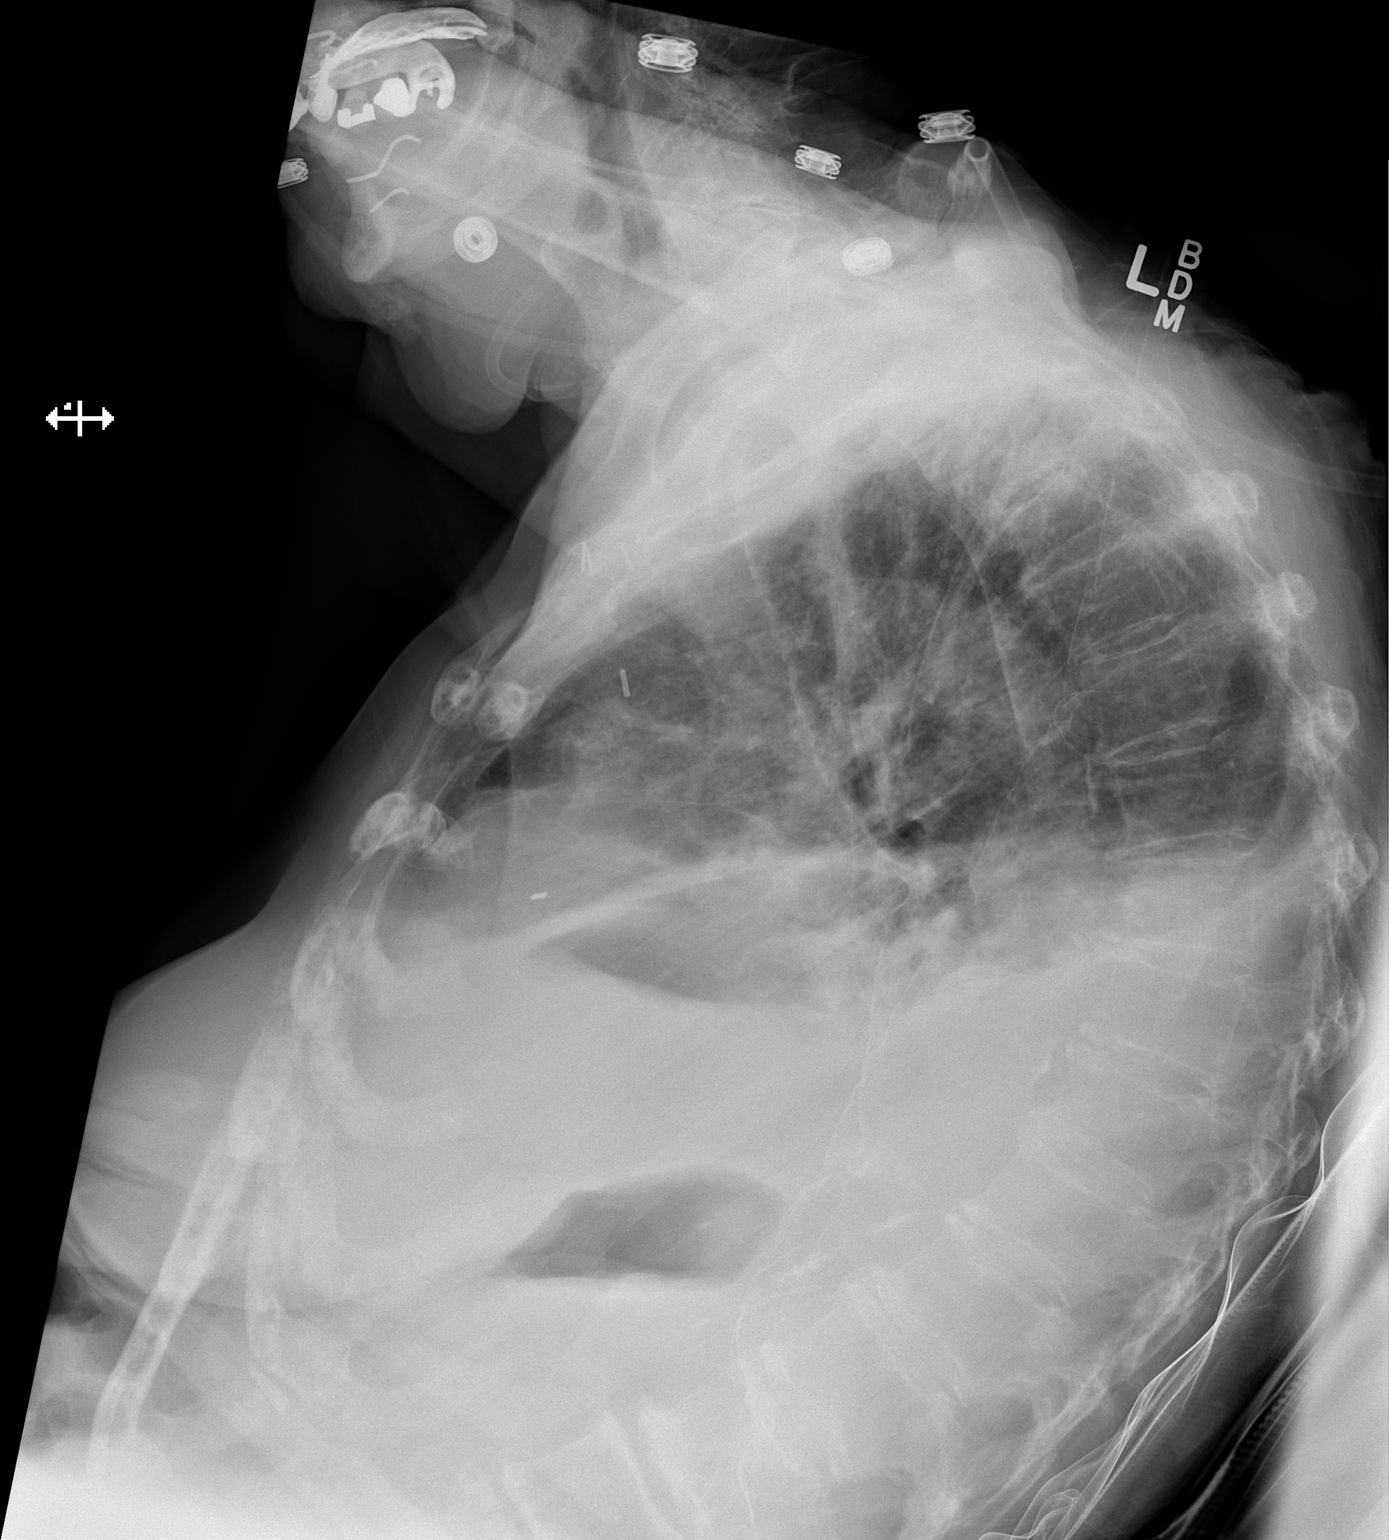

[x chest ap]
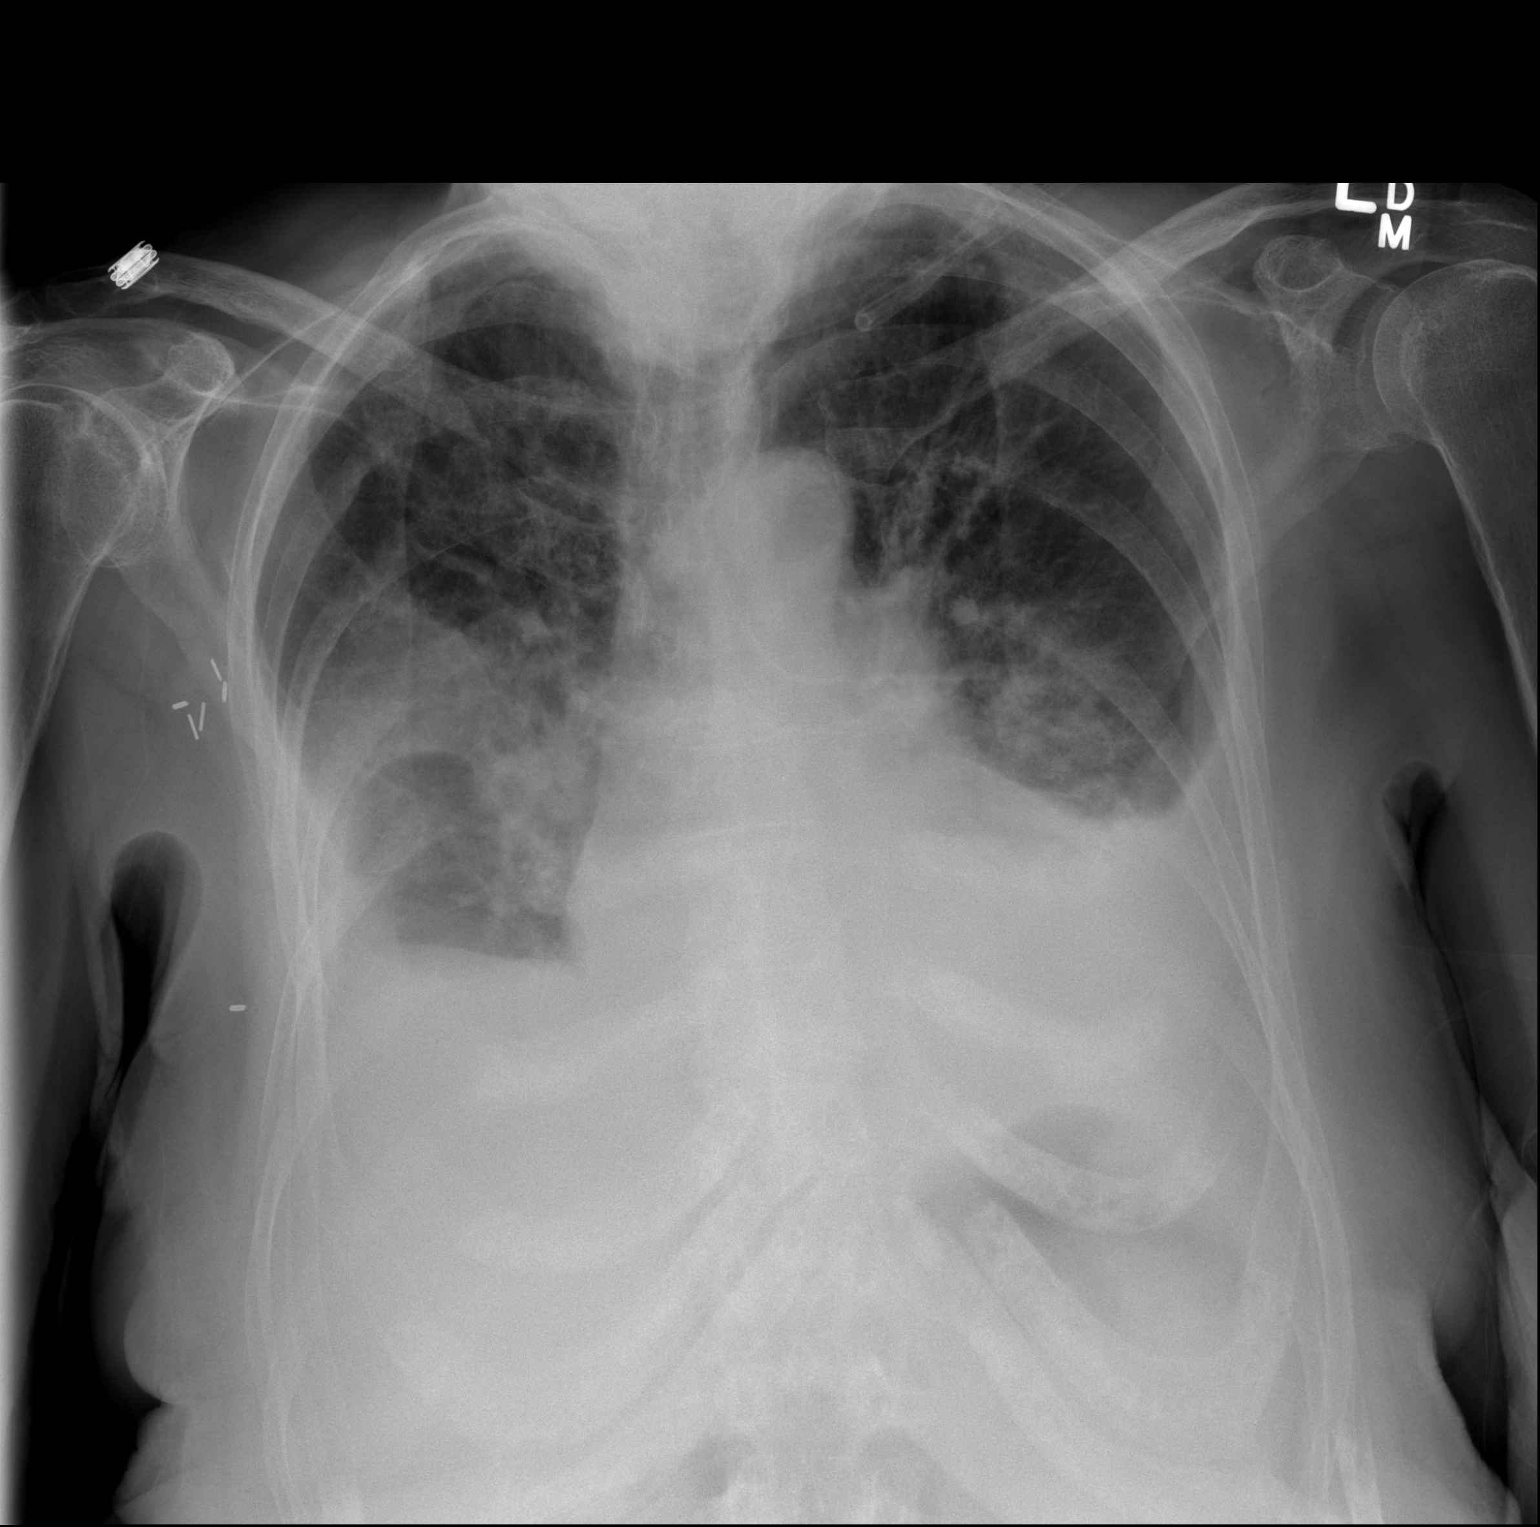

[2 of 2 positions shown; findings below may reference images not displayed]

FINDINGS: The lungs are better inflated today. There is a persistent small
right pleural effusion and moderate size left pleural effusion
blunting the costophrenic angles. The cardiac silhouette is normal
in size. The central pulmonary vascularity is prominent. There is
soft tissue density in the perihilar regions bilaterally which is
more conspicuous than in the past and may reflect interstitial edema
or developing pneumonia. There surgical clips in the right axillary
region. There is prominent thoracic kyphosis.
IMPRESSION: Bilateral pleural effusions persist. Increased density in the
perihilar regions is consistent with mild interstitial edema or
pneumonia. Overall the appearance of the pulmonary interstitium has
deteriorated somewhat since the earlier study. The pleural effusions
are not greatly changed.

## 2015-02-01 IMAGING — CR DG CHEST 2V
2 series · 2 of 2 positions shown · non-contrast
Comparison: DG CHEST 2 VIEW dated 07/27/2013

CLINICAL DATA: Fever

EXAM:
CHEST  2 VIEW

[w chest lat]
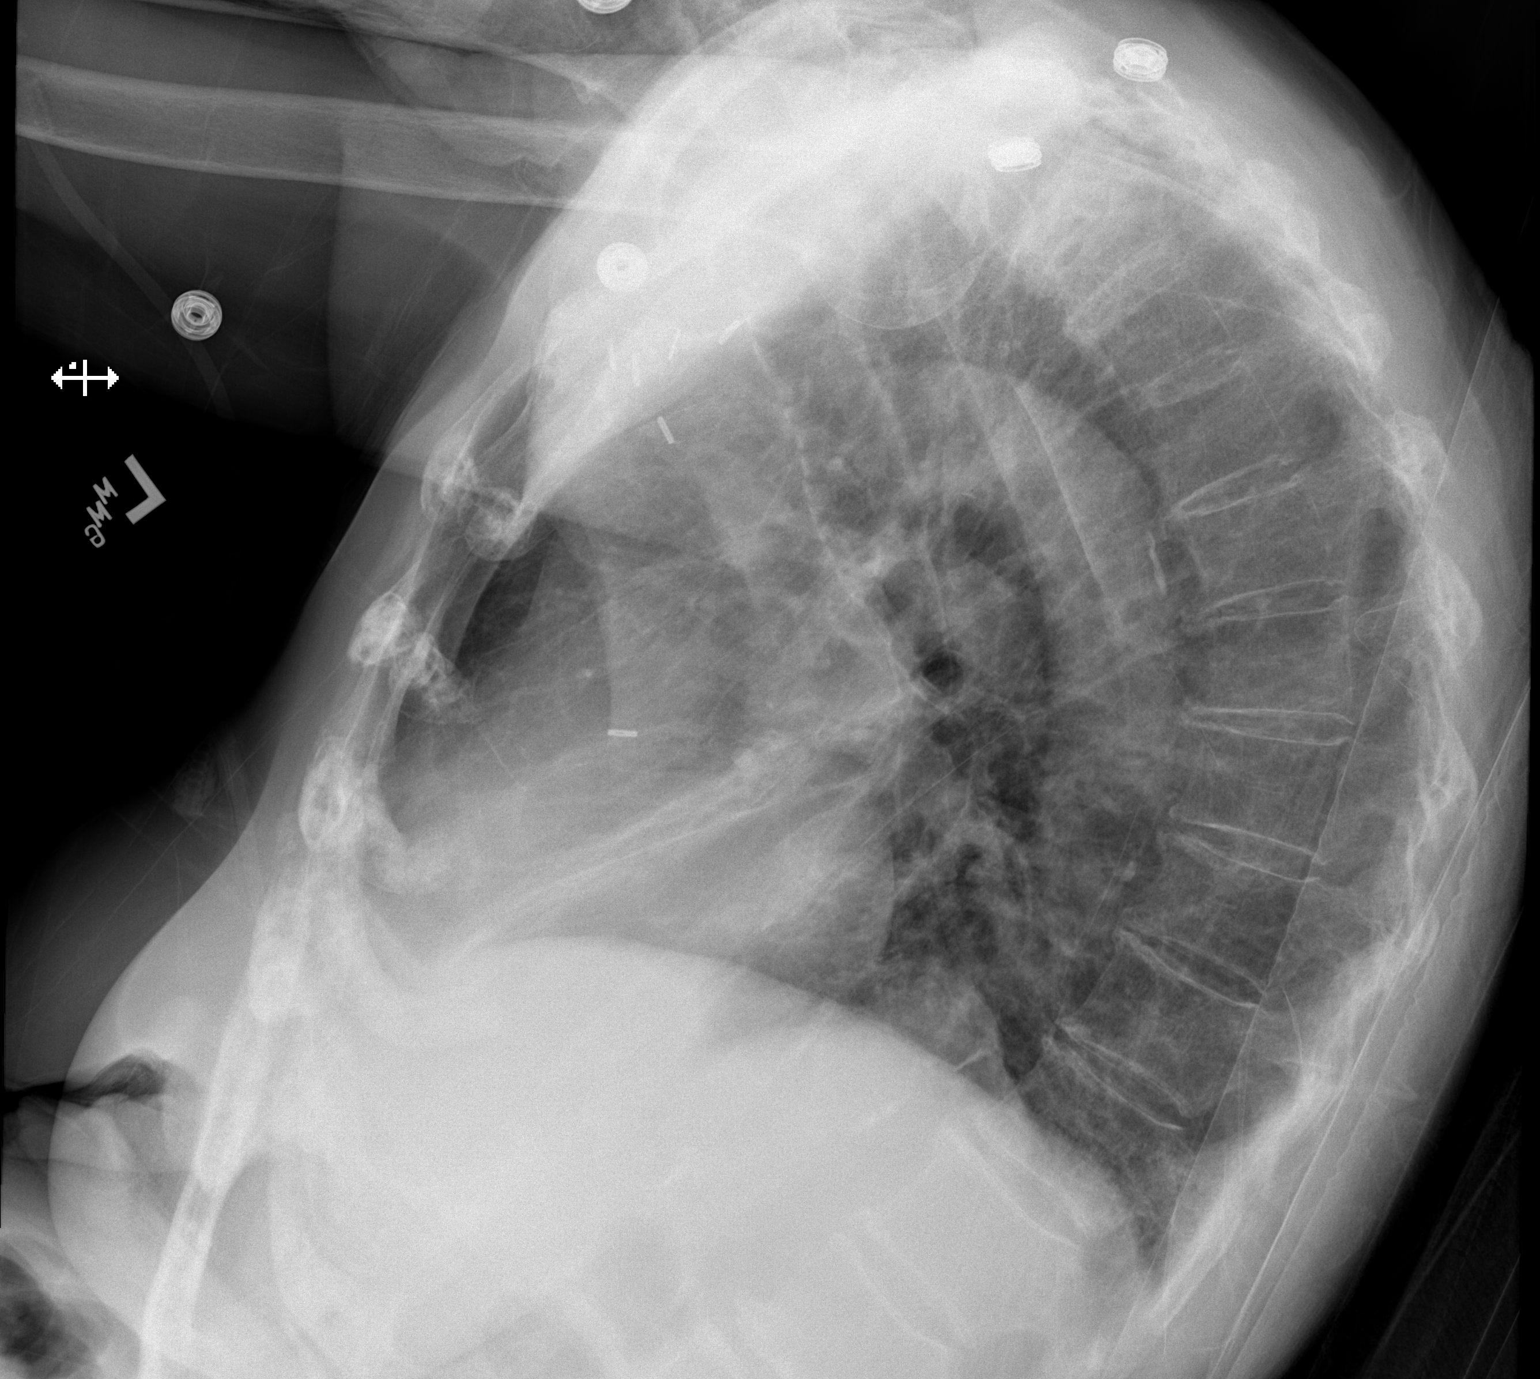

[x chest ap]
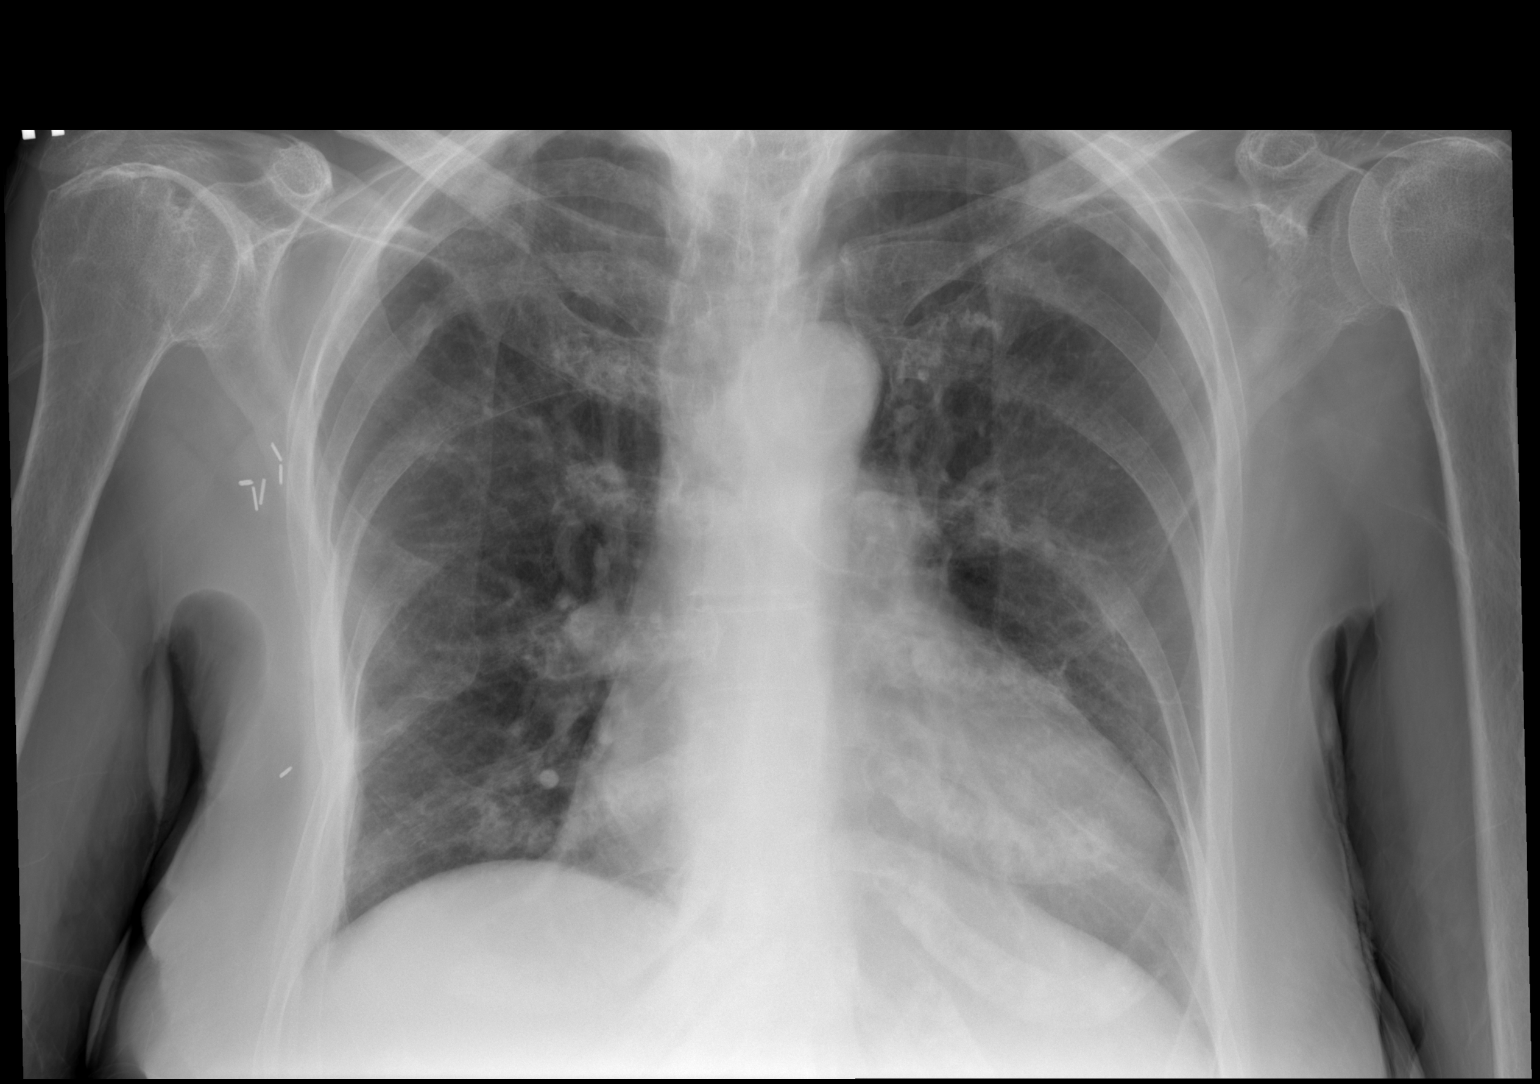

[2 of 2 positions shown; findings below may reference images not displayed]

FINDINGS: There are bilateral chronic bronchitic changes. There is no focal
parenchymal opacity, pleural effusion, or pneumothorax. The heart
and mediastinal contours are unremarkable.

There are surgical clips in the right axilla.

The osseous structures are unremarkable.
IMPRESSION: No active cardiopulmonary disease.

## 2016-09-21 DIAGNOSIS — I509 Heart failure, unspecified: Secondary | ICD-10-CM

## 2016-09-21 DIAGNOSIS — J90 Pleural effusion, not elsewhere classified: Secondary | ICD-10-CM | POA: Diagnosis not present

## 2016-09-21 DIAGNOSIS — J9601 Acute respiratory failure with hypoxia: Secondary | ICD-10-CM

## 2016-09-21 DIAGNOSIS — I10 Essential (primary) hypertension: Secondary | ICD-10-CM

## 2016-09-22 DIAGNOSIS — I509 Heart failure, unspecified: Secondary | ICD-10-CM | POA: Diagnosis not present

## 2016-09-22 DIAGNOSIS — I10 Essential (primary) hypertension: Secondary | ICD-10-CM | POA: Diagnosis not present

## 2016-09-22 DIAGNOSIS — J9601 Acute respiratory failure with hypoxia: Secondary | ICD-10-CM | POA: Diagnosis not present

## 2016-09-22 DIAGNOSIS — J90 Pleural effusion, not elsewhere classified: Secondary | ICD-10-CM | POA: Diagnosis not present

## 2016-09-23 DIAGNOSIS — I509 Heart failure, unspecified: Secondary | ICD-10-CM | POA: Diagnosis not present

## 2016-09-23 DIAGNOSIS — I10 Essential (primary) hypertension: Secondary | ICD-10-CM | POA: Diagnosis not present

## 2016-09-23 DIAGNOSIS — J9601 Acute respiratory failure with hypoxia: Secondary | ICD-10-CM | POA: Diagnosis not present

## 2016-09-23 DIAGNOSIS — J90 Pleural effusion, not elsewhere classified: Secondary | ICD-10-CM | POA: Diagnosis not present

## 2017-09-16 DIAGNOSIS — I129 Hypertensive chronic kidney disease with stage 1 through stage 4 chronic kidney disease, or unspecified chronic kidney disease: Secondary | ICD-10-CM | POA: Diagnosis not present

## 2017-09-16 DIAGNOSIS — N183 Chronic kidney disease, stage 3 (moderate): Secondary | ICD-10-CM | POA: Diagnosis not present

## 2017-09-16 DIAGNOSIS — R69 Illness, unspecified: Secondary | ICD-10-CM | POA: Diagnosis not present

## 2017-09-16 DIAGNOSIS — I10 Essential (primary) hypertension: Secondary | ICD-10-CM | POA: Diagnosis not present

## 2017-09-16 DIAGNOSIS — K5909 Other constipation: Secondary | ICD-10-CM | POA: Diagnosis not present

## 2017-09-16 DIAGNOSIS — E7849 Other hyperlipidemia: Secondary | ICD-10-CM | POA: Diagnosis not present

## 2017-09-16 DIAGNOSIS — D692 Other nonthrombocytopenic purpura: Secondary | ICD-10-CM | POA: Diagnosis not present

## 2017-09-16 DIAGNOSIS — G629 Polyneuropathy, unspecified: Secondary | ICD-10-CM | POA: Diagnosis not present

## 2017-09-16 DIAGNOSIS — I509 Heart failure, unspecified: Secondary | ICD-10-CM | POA: Diagnosis not present

## 2017-09-16 DIAGNOSIS — C50919 Malignant neoplasm of unspecified site of unspecified female breast: Secondary | ICD-10-CM | POA: Diagnosis not present

## 2017-09-23 DIAGNOSIS — J90 Pleural effusion, not elsewhere classified: Secondary | ICD-10-CM | POA: Diagnosis not present

## 2017-09-23 DIAGNOSIS — I5040 Unspecified combined systolic (congestive) and diastolic (congestive) heart failure: Secondary | ICD-10-CM | POA: Diagnosis not present

## 2017-09-23 DIAGNOSIS — J9601 Acute respiratory failure with hypoxia: Secondary | ICD-10-CM | POA: Diagnosis not present

## 2017-10-24 DIAGNOSIS — J90 Pleural effusion, not elsewhere classified: Secondary | ICD-10-CM | POA: Diagnosis not present

## 2017-10-24 DIAGNOSIS — I5040 Unspecified combined systolic (congestive) and diastolic (congestive) heart failure: Secondary | ICD-10-CM | POA: Diagnosis not present

## 2017-10-24 DIAGNOSIS — J9601 Acute respiratory failure with hypoxia: Secondary | ICD-10-CM | POA: Diagnosis not present

## 2017-11-21 DIAGNOSIS — J9601 Acute respiratory failure with hypoxia: Secondary | ICD-10-CM | POA: Diagnosis not present

## 2017-11-21 DIAGNOSIS — J90 Pleural effusion, not elsewhere classified: Secondary | ICD-10-CM | POA: Diagnosis not present

## 2017-11-21 DIAGNOSIS — I5040 Unspecified combined systolic (congestive) and diastolic (congestive) heart failure: Secondary | ICD-10-CM | POA: Diagnosis not present

## 2017-12-22 DIAGNOSIS — I5040 Unspecified combined systolic (congestive) and diastolic (congestive) heart failure: Secondary | ICD-10-CM | POA: Diagnosis not present

## 2017-12-22 DIAGNOSIS — J9601 Acute respiratory failure with hypoxia: Secondary | ICD-10-CM | POA: Diagnosis not present

## 2017-12-22 DIAGNOSIS — J90 Pleural effusion, not elsewhere classified: Secondary | ICD-10-CM | POA: Diagnosis not present

## 2018-01-21 DIAGNOSIS — I5040 Unspecified combined systolic (congestive) and diastolic (congestive) heart failure: Secondary | ICD-10-CM | POA: Diagnosis not present

## 2018-01-21 DIAGNOSIS — J9601 Acute respiratory failure with hypoxia: Secondary | ICD-10-CM | POA: Diagnosis not present

## 2018-01-21 DIAGNOSIS — J90 Pleural effusion, not elsewhere classified: Secondary | ICD-10-CM | POA: Diagnosis not present

## 2018-02-21 DIAGNOSIS — J90 Pleural effusion, not elsewhere classified: Secondary | ICD-10-CM | POA: Diagnosis not present

## 2018-02-21 DIAGNOSIS — I5040 Unspecified combined systolic (congestive) and diastolic (congestive) heart failure: Secondary | ICD-10-CM | POA: Diagnosis not present

## 2018-02-21 DIAGNOSIS — J9601 Acute respiratory failure with hypoxia: Secondary | ICD-10-CM | POA: Diagnosis not present

## 2018-03-23 DIAGNOSIS — J90 Pleural effusion, not elsewhere classified: Secondary | ICD-10-CM | POA: Diagnosis not present

## 2018-03-23 DIAGNOSIS — J9601 Acute respiratory failure with hypoxia: Secondary | ICD-10-CM | POA: Diagnosis not present

## 2018-03-23 DIAGNOSIS — I5040 Unspecified combined systolic (congestive) and diastolic (congestive) heart failure: Secondary | ICD-10-CM | POA: Diagnosis not present

## 2018-04-02 DIAGNOSIS — I509 Heart failure, unspecified: Secondary | ICD-10-CM | POA: Diagnosis not present

## 2018-04-02 DIAGNOSIS — D692 Other nonthrombocytopenic purpura: Secondary | ICD-10-CM | POA: Diagnosis not present

## 2018-04-02 DIAGNOSIS — K5909 Other constipation: Secondary | ICD-10-CM | POA: Diagnosis not present

## 2018-04-02 DIAGNOSIS — R69 Illness, unspecified: Secondary | ICD-10-CM | POA: Diagnosis not present

## 2018-04-02 DIAGNOSIS — Z0000 Encounter for general adult medical examination without abnormal findings: Secondary | ICD-10-CM | POA: Diagnosis not present

## 2018-04-02 DIAGNOSIS — R82998 Other abnormal findings in urine: Secondary | ICD-10-CM | POA: Diagnosis not present

## 2018-04-02 DIAGNOSIS — I10 Essential (primary) hypertension: Secondary | ICD-10-CM | POA: Diagnosis not present

## 2018-04-02 DIAGNOSIS — R32 Unspecified urinary incontinence: Secondary | ICD-10-CM | POA: Diagnosis not present

## 2018-04-02 DIAGNOSIS — E7849 Other hyperlipidemia: Secondary | ICD-10-CM | POA: Diagnosis not present

## 2018-04-02 DIAGNOSIS — I129 Hypertensive chronic kidney disease with stage 1 through stage 4 chronic kidney disease, or unspecified chronic kidney disease: Secondary | ICD-10-CM | POA: Diagnosis not present

## 2018-04-02 DIAGNOSIS — G6289 Other specified polyneuropathies: Secondary | ICD-10-CM | POA: Diagnosis not present

## 2018-04-02 DIAGNOSIS — N183 Chronic kidney disease, stage 3 (moderate): Secondary | ICD-10-CM | POA: Diagnosis not present

## 2018-04-23 DIAGNOSIS — J90 Pleural effusion, not elsewhere classified: Secondary | ICD-10-CM | POA: Diagnosis not present

## 2018-04-23 DIAGNOSIS — I5040 Unspecified combined systolic (congestive) and diastolic (congestive) heart failure: Secondary | ICD-10-CM | POA: Diagnosis not present

## 2018-04-23 DIAGNOSIS — J9601 Acute respiratory failure with hypoxia: Secondary | ICD-10-CM | POA: Diagnosis not present

## 2018-05-24 DIAGNOSIS — J90 Pleural effusion, not elsewhere classified: Secondary | ICD-10-CM | POA: Diagnosis not present

## 2018-05-24 DIAGNOSIS — I5040 Unspecified combined systolic (congestive) and diastolic (congestive) heart failure: Secondary | ICD-10-CM | POA: Diagnosis not present

## 2018-05-24 DIAGNOSIS — J9601 Acute respiratory failure with hypoxia: Secondary | ICD-10-CM | POA: Diagnosis not present

## 2018-06-23 DIAGNOSIS — I5040 Unspecified combined systolic (congestive) and diastolic (congestive) heart failure: Secondary | ICD-10-CM | POA: Diagnosis not present

## 2018-06-23 DIAGNOSIS — J9601 Acute respiratory failure with hypoxia: Secondary | ICD-10-CM | POA: Diagnosis not present

## 2018-06-23 DIAGNOSIS — J90 Pleural effusion, not elsewhere classified: Secondary | ICD-10-CM | POA: Diagnosis not present

## 2018-07-04 DIAGNOSIS — R69 Illness, unspecified: Secondary | ICD-10-CM | POA: Diagnosis not present

## 2018-07-24 DIAGNOSIS — I5040 Unspecified combined systolic (congestive) and diastolic (congestive) heart failure: Secondary | ICD-10-CM | POA: Diagnosis not present

## 2018-07-24 DIAGNOSIS — J90 Pleural effusion, not elsewhere classified: Secondary | ICD-10-CM | POA: Diagnosis not present

## 2018-07-24 DIAGNOSIS — J9601 Acute respiratory failure with hypoxia: Secondary | ICD-10-CM | POA: Diagnosis not present

## 2018-08-23 DIAGNOSIS — J90 Pleural effusion, not elsewhere classified: Secondary | ICD-10-CM | POA: Diagnosis not present

## 2018-08-23 DIAGNOSIS — I5040 Unspecified combined systolic (congestive) and diastolic (congestive) heart failure: Secondary | ICD-10-CM | POA: Diagnosis not present

## 2018-08-23 DIAGNOSIS — J9601 Acute respiratory failure with hypoxia: Secondary | ICD-10-CM | POA: Diagnosis not present

## 2018-09-23 DIAGNOSIS — I5040 Unspecified combined systolic (congestive) and diastolic (congestive) heart failure: Secondary | ICD-10-CM | POA: Diagnosis not present

## 2018-09-23 DIAGNOSIS — J9601 Acute respiratory failure with hypoxia: Secondary | ICD-10-CM | POA: Diagnosis not present

## 2018-09-23 DIAGNOSIS — J90 Pleural effusion, not elsewhere classified: Secondary | ICD-10-CM | POA: Diagnosis not present

## 2018-10-01 DIAGNOSIS — I4891 Unspecified atrial fibrillation: Secondary | ICD-10-CM | POA: Diagnosis not present

## 2018-10-01 DIAGNOSIS — I129 Hypertensive chronic kidney disease with stage 1 through stage 4 chronic kidney disease, or unspecified chronic kidney disease: Secondary | ICD-10-CM | POA: Diagnosis not present

## 2018-10-01 DIAGNOSIS — I509 Heart failure, unspecified: Secondary | ICD-10-CM | POA: Diagnosis not present

## 2018-10-01 DIAGNOSIS — K5909 Other constipation: Secondary | ICD-10-CM | POA: Diagnosis not present

## 2018-10-01 DIAGNOSIS — I10 Essential (primary) hypertension: Secondary | ICD-10-CM | POA: Diagnosis not present

## 2018-10-01 DIAGNOSIS — M1990 Unspecified osteoarthritis, unspecified site: Secondary | ICD-10-CM | POA: Diagnosis not present

## 2018-10-01 DIAGNOSIS — E7849 Other hyperlipidemia: Secondary | ICD-10-CM | POA: Diagnosis not present

## 2018-10-01 DIAGNOSIS — N183 Chronic kidney disease, stage 3 (moderate): Secondary | ICD-10-CM | POA: Diagnosis not present

## 2018-10-01 DIAGNOSIS — R32 Unspecified urinary incontinence: Secondary | ICD-10-CM | POA: Diagnosis not present

## 2018-10-01 DIAGNOSIS — C50919 Malignant neoplasm of unspecified site of unspecified female breast: Secondary | ICD-10-CM | POA: Diagnosis not present

## 2018-10-24 DIAGNOSIS — I5040 Unspecified combined systolic (congestive) and diastolic (congestive) heart failure: Secondary | ICD-10-CM | POA: Diagnosis not present

## 2018-10-24 DIAGNOSIS — J9601 Acute respiratory failure with hypoxia: Secondary | ICD-10-CM | POA: Diagnosis not present

## 2018-10-24 DIAGNOSIS — J90 Pleural effusion, not elsewhere classified: Secondary | ICD-10-CM | POA: Diagnosis not present

## 2018-11-22 DIAGNOSIS — I5040 Unspecified combined systolic (congestive) and diastolic (congestive) heart failure: Secondary | ICD-10-CM | POA: Diagnosis not present

## 2018-11-22 DIAGNOSIS — J90 Pleural effusion, not elsewhere classified: Secondary | ICD-10-CM | POA: Diagnosis not present

## 2018-11-22 DIAGNOSIS — J9601 Acute respiratory failure with hypoxia: Secondary | ICD-10-CM | POA: Diagnosis not present

## 2018-12-23 DIAGNOSIS — I5040 Unspecified combined systolic (congestive) and diastolic (congestive) heart failure: Secondary | ICD-10-CM | POA: Diagnosis not present

## 2018-12-23 DIAGNOSIS — J90 Pleural effusion, not elsewhere classified: Secondary | ICD-10-CM | POA: Diagnosis not present

## 2018-12-23 DIAGNOSIS — J9601 Acute respiratory failure with hypoxia: Secondary | ICD-10-CM | POA: Diagnosis not present

## 2019-01-22 DIAGNOSIS — J90 Pleural effusion, not elsewhere classified: Secondary | ICD-10-CM | POA: Diagnosis not present

## 2019-01-22 DIAGNOSIS — J9601 Acute respiratory failure with hypoxia: Secondary | ICD-10-CM | POA: Diagnosis not present

## 2019-01-22 DIAGNOSIS — I5040 Unspecified combined systolic (congestive) and diastolic (congestive) heart failure: Secondary | ICD-10-CM | POA: Diagnosis not present

## 2019-02-22 DIAGNOSIS — J9601 Acute respiratory failure with hypoxia: Secondary | ICD-10-CM | POA: Diagnosis not present

## 2019-02-22 DIAGNOSIS — I5040 Unspecified combined systolic (congestive) and diastolic (congestive) heart failure: Secondary | ICD-10-CM | POA: Diagnosis not present

## 2019-02-22 DIAGNOSIS — J90 Pleural effusion, not elsewhere classified: Secondary | ICD-10-CM | POA: Diagnosis not present

## 2019-03-24 DIAGNOSIS — J9601 Acute respiratory failure with hypoxia: Secondary | ICD-10-CM | POA: Diagnosis not present

## 2019-03-24 DIAGNOSIS — I5040 Unspecified combined systolic (congestive) and diastolic (congestive) heart failure: Secondary | ICD-10-CM | POA: Diagnosis not present

## 2019-03-24 DIAGNOSIS — J90 Pleural effusion, not elsewhere classified: Secondary | ICD-10-CM | POA: Diagnosis not present

## 2019-04-15 DIAGNOSIS — E7849 Other hyperlipidemia: Secondary | ICD-10-CM | POA: Diagnosis not present

## 2019-04-22 DIAGNOSIS — Z0000 Encounter for general adult medical examination without abnormal findings: Secondary | ICD-10-CM | POA: Diagnosis not present

## 2019-04-22 DIAGNOSIS — I509 Heart failure, unspecified: Secondary | ICD-10-CM | POA: Diagnosis not present

## 2019-04-22 DIAGNOSIS — E785 Hyperlipidemia, unspecified: Secondary | ICD-10-CM | POA: Diagnosis not present

## 2019-04-22 DIAGNOSIS — I129 Hypertensive chronic kidney disease with stage 1 through stage 4 chronic kidney disease, or unspecified chronic kidney disease: Secondary | ICD-10-CM | POA: Diagnosis not present

## 2019-04-22 DIAGNOSIS — E876 Hypokalemia: Secondary | ICD-10-CM | POA: Diagnosis not present

## 2019-04-22 DIAGNOSIS — R32 Unspecified urinary incontinence: Secondary | ICD-10-CM | POA: Diagnosis not present

## 2019-04-22 DIAGNOSIS — C50919 Malignant neoplasm of unspecified site of unspecified female breast: Secondary | ICD-10-CM | POA: Diagnosis not present

## 2019-04-22 DIAGNOSIS — I4891 Unspecified atrial fibrillation: Secondary | ICD-10-CM | POA: Diagnosis not present

## 2019-04-22 DIAGNOSIS — N183 Chronic kidney disease, stage 3 (moderate): Secondary | ICD-10-CM | POA: Diagnosis not present

## 2019-04-22 DIAGNOSIS — M1990 Unspecified osteoarthritis, unspecified site: Secondary | ICD-10-CM | POA: Diagnosis not present

## 2019-04-24 DIAGNOSIS — J9601 Acute respiratory failure with hypoxia: Secondary | ICD-10-CM | POA: Diagnosis not present

## 2019-04-24 DIAGNOSIS — J90 Pleural effusion, not elsewhere classified: Secondary | ICD-10-CM | POA: Diagnosis not present

## 2019-04-24 DIAGNOSIS — I5040 Unspecified combined systolic (congestive) and diastolic (congestive) heart failure: Secondary | ICD-10-CM | POA: Diagnosis not present

## 2019-05-25 DIAGNOSIS — J90 Pleural effusion, not elsewhere classified: Secondary | ICD-10-CM | POA: Diagnosis not present

## 2019-05-25 DIAGNOSIS — I5040 Unspecified combined systolic (congestive) and diastolic (congestive) heart failure: Secondary | ICD-10-CM | POA: Diagnosis not present

## 2019-05-25 DIAGNOSIS — J9601 Acute respiratory failure with hypoxia: Secondary | ICD-10-CM | POA: Diagnosis not present

## 2019-07-05 DEATH — deceased
